# Patient Record
Sex: Female | Born: 1950 | Race: White | Hispanic: No | Marital: Married | Smoking: Never smoker
Health system: Southern US, Community
[De-identification: ages and names within clinical notes are randomized; demographics above are authoritative.]

## PROBLEM LIST (undated history)

## (undated) DIAGNOSIS — K219 Gastro-esophageal reflux disease without esophagitis: Secondary | ICD-10-CM

## (undated) DIAGNOSIS — N200 Calculus of kidney: Secondary | ICD-10-CM

## (undated) DIAGNOSIS — H35349 Macular cyst, hole, or pseudohole, unspecified eye: Secondary | ICD-10-CM

## (undated) DIAGNOSIS — Z8719 Personal history of other diseases of the digestive system: Secondary | ICD-10-CM

## (undated) DIAGNOSIS — Z87442 Personal history of urinary calculi: Secondary | ICD-10-CM

## (undated) DIAGNOSIS — F329 Major depressive disorder, single episode, unspecified: Secondary | ICD-10-CM

## (undated) DIAGNOSIS — M1711 Unilateral primary osteoarthritis, right knee: Secondary | ICD-10-CM

## (undated) DIAGNOSIS — F32A Depression, unspecified: Secondary | ICD-10-CM

## (undated) DIAGNOSIS — J189 Pneumonia, unspecified organism: Secondary | ICD-10-CM

## (undated) HISTORY — PX: EYE SURGERY: SHX253

## (undated) HISTORY — PX: PLACEMENT OF BREAST IMPLANTS: SHX6334

## (undated) HISTORY — PX: LAPAROTOMY: SHX154

## (undated) HISTORY — PX: COLONOSCOPY: SHX174

## (undated) HISTORY — PX: CHOLECYSTECTOMY: SHX55

## (undated) HISTORY — PX: WRIST SURGERY: SHX841

---

## 2002-10-08 ENCOUNTER — Ambulatory Visit (HOSPITAL_COMMUNITY): Admission: RE | Admit: 2002-10-08 | Discharge: 2002-10-08 | Payer: Self-pay | Admitting: General Surgery

## 2005-02-09 ENCOUNTER — Emergency Department (HOSPITAL_COMMUNITY): Admission: EM | Admit: 2005-02-09 | Discharge: 2005-02-09 | Payer: Self-pay | Admitting: Family Medicine

## 2005-06-05 ENCOUNTER — Emergency Department (HOSPITAL_COMMUNITY): Admission: EM | Admit: 2005-06-05 | Discharge: 2005-06-05 | Payer: Self-pay | Admitting: Family Medicine

## 2006-07-05 IMAGING — CR DG WRIST COMPLETE 3+V*L*
3 series · 3 of 3 positions shown · non-contrast
Comparison: None.

CLINICAL DATA: 54-year-old with wrist laceration.  Door fell on wrist.  Laceration of the radial aspect.  Hand pain with movement.

[view not recorded (1 of 3)]
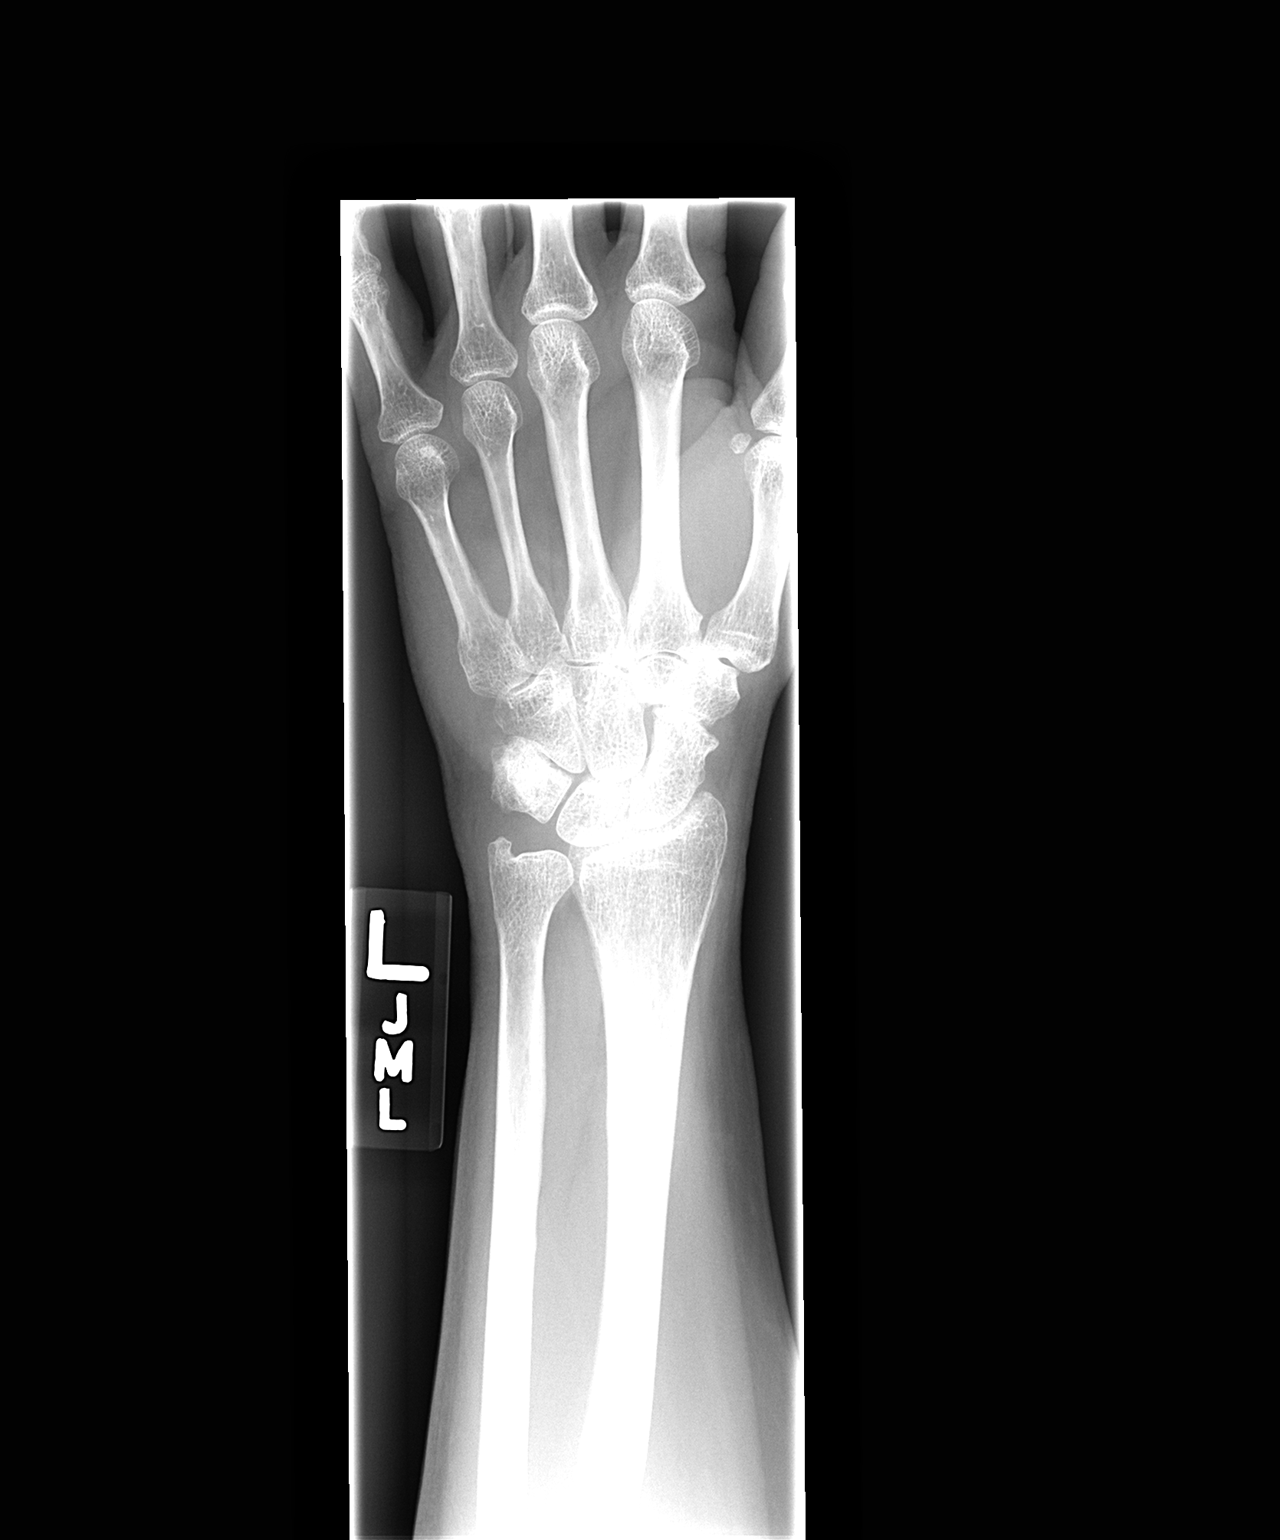

[view not recorded (2 of 3)]
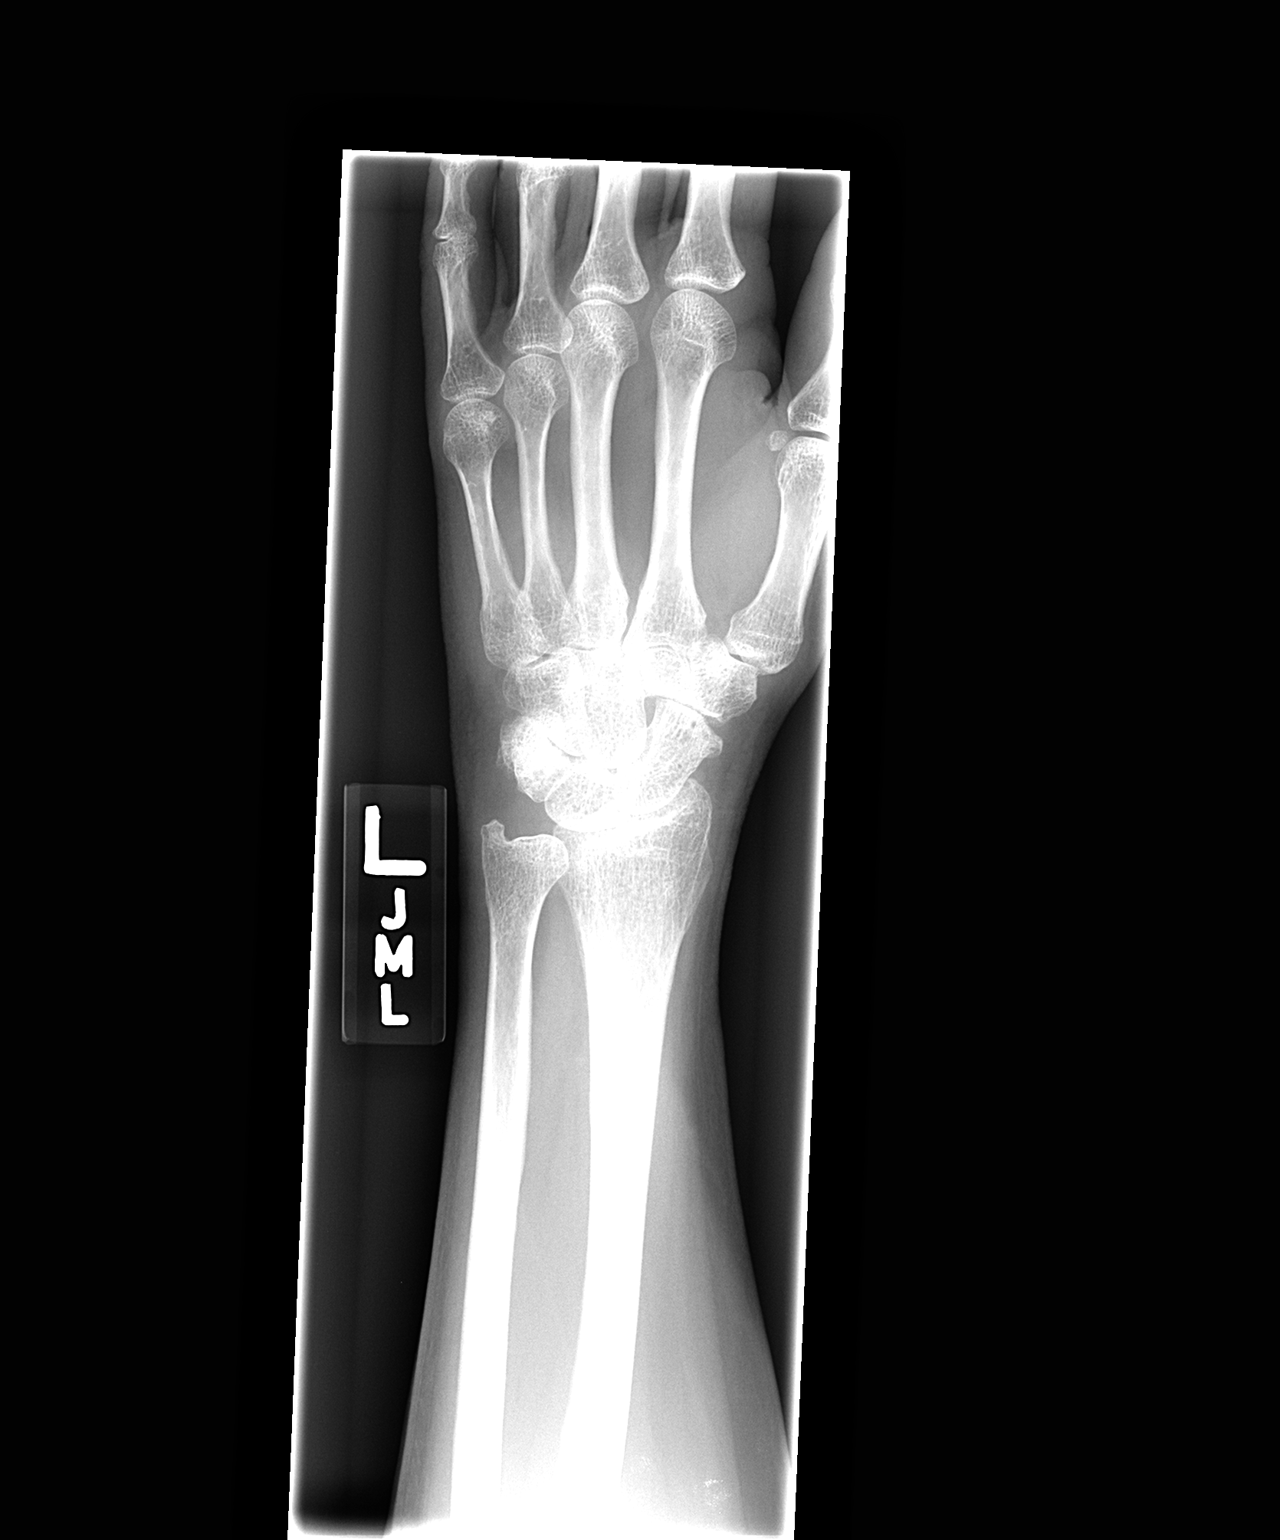

[view not recorded (3 of 3)]
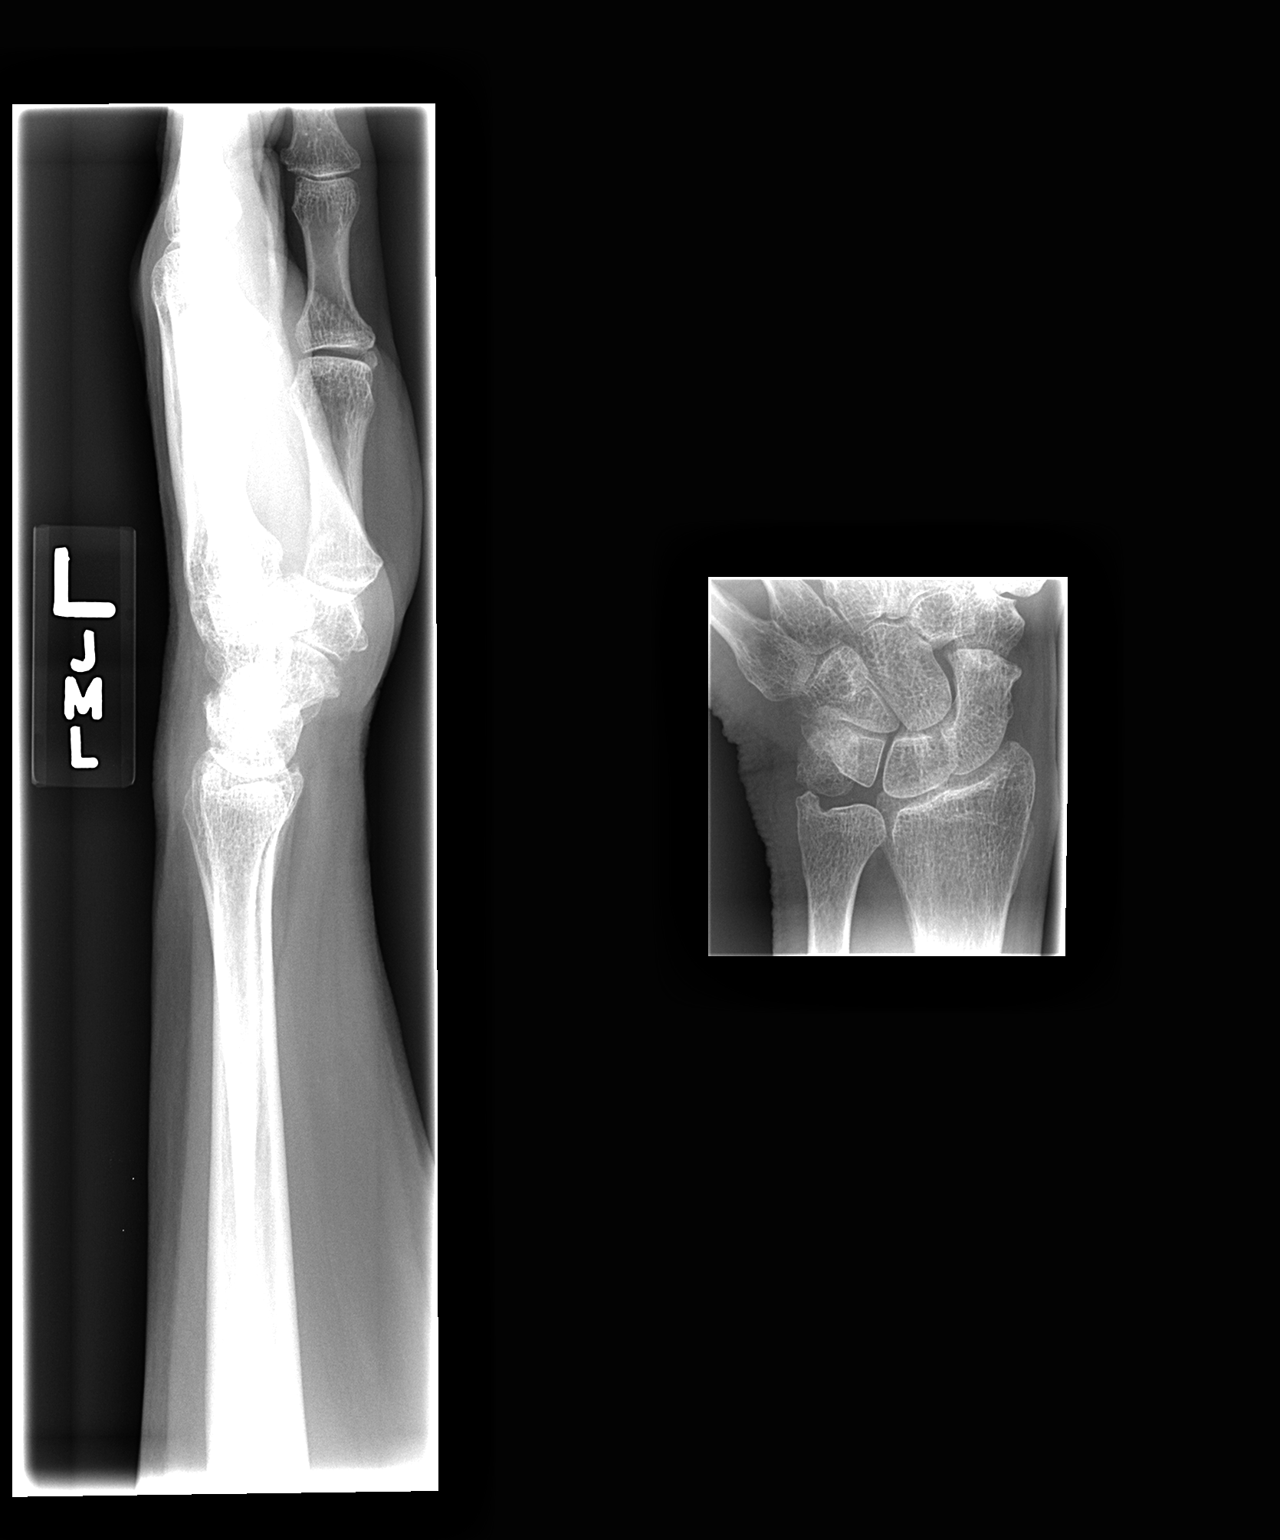

[3 of 3 positions shown; findings below may reference images not displayed]

LEFT ABLY0-Q VIEWS:
 There is marked osteopenia.  This does limit evaluation of minimally displaced and non-displaced fractures.  However, no evidence for acute fracture or dislocation.
IMPRESSION: Marked osteopenia without evidence for foreign body or acute bony abnormality.

## 2009-07-07 ENCOUNTER — Encounter: Payer: Self-pay | Admitting: Gastroenterology

## 2009-07-16 ENCOUNTER — Encounter: Payer: Self-pay | Admitting: Gastroenterology

## 2010-02-24 ENCOUNTER — Emergency Department (HOSPITAL_COMMUNITY): Admission: EM | Admit: 2010-02-24 | Discharge: 2010-02-25 | Payer: Self-pay | Admitting: Emergency Medicine

## 2010-02-25 ENCOUNTER — Inpatient Hospital Stay (HOSPITAL_COMMUNITY): Admission: AD | Admit: 2010-02-25 | Discharge: 2010-02-28 | Payer: Self-pay | Admitting: Psychiatry

## 2010-02-25 ENCOUNTER — Ambulatory Visit: Payer: Self-pay | Admitting: Psychiatry

## 2010-03-10 ENCOUNTER — Encounter (INDEPENDENT_AMBULATORY_CARE_PROVIDER_SITE_OTHER): Payer: Self-pay | Admitting: *Deleted

## 2010-04-08 ENCOUNTER — Encounter (INDEPENDENT_AMBULATORY_CARE_PROVIDER_SITE_OTHER): Payer: Self-pay | Admitting: *Deleted

## 2010-04-08 ENCOUNTER — Ambulatory Visit: Payer: Self-pay | Admitting: Gastroenterology

## 2010-04-08 DIAGNOSIS — M899 Disorder of bone, unspecified: Secondary | ICD-10-CM | POA: Insufficient documentation

## 2010-04-08 DIAGNOSIS — F329 Major depressive disorder, single episode, unspecified: Secondary | ICD-10-CM

## 2010-04-08 DIAGNOSIS — M949 Disorder of cartilage, unspecified: Secondary | ICD-10-CM

## 2010-04-08 DIAGNOSIS — K219 Gastro-esophageal reflux disease without esophagitis: Secondary | ICD-10-CM | POA: Insufficient documentation

## 2010-04-08 LAB — CONVERTED CEMR LAB
AST: 43 units/L — ABNORMAL HIGH (ref 0–37)
BUN: 15 mg/dL (ref 6–23)
Basophils Absolute: 0 10*3/uL (ref 0.0–0.1)
Bilirubin, Direct: 0.1 mg/dL (ref 0.0–0.3)
Chloride: 101 meq/L (ref 96–112)
Creatinine, Ser: 0.7 mg/dL (ref 0.4–1.2)
Ferritin: 19.8 ng/mL (ref 10.0–291.0)
Glucose, Bld: 81 mg/dL (ref 70–99)
Hemoglobin: 12.5 g/dL (ref 12.0–15.0)
IgA: 267 mg/dL (ref 68–378)
Iron: 89 ug/dL (ref 42–145)
Lymphocytes Relative: 24.5 % (ref 12.0–46.0)
Lymphs Abs: 1.4 10*3/uL (ref 0.7–4.0)
MCHC: 33.8 g/dL (ref 30.0–36.0)
MCV: 88.7 fL (ref 78.0–100.0)
Monocytes Absolute: 0.3 10*3/uL (ref 0.1–1.0)
Monocytes Relative: 5.6 % (ref 3.0–12.0)
Neutro Abs: 4 10*3/uL (ref 1.4–7.7)
RBC: 4.18 M/uL (ref 3.87–5.11)
RDW: 14.3 % (ref 11.5–14.6)
Saturation Ratios: 20.7 % (ref 20.0–50.0)
Sed Rate: 19 mm/hr (ref 0–22)
Sodium: 141 meq/L (ref 135–145)
TSH: 0.89 microintl units/mL (ref 0.35–5.50)
Tissue Transglutaminase Ab, IgA: 4.6 units (ref <20)
Transferrin: 307.4 mg/dL (ref 212.0–360.0)
Vitamin B-12: 304 pg/mL (ref 211–911)

## 2010-04-26 ENCOUNTER — Ambulatory Visit: Payer: Self-pay | Admitting: Gastroenterology

## 2010-04-29 ENCOUNTER — Encounter: Payer: Self-pay | Admitting: Gastroenterology

## 2010-05-10 ENCOUNTER — Ambulatory Visit: Payer: Self-pay | Admitting: Gastroenterology

## 2010-05-10 DIAGNOSIS — R945 Abnormal results of liver function studies: Secondary | ICD-10-CM | POA: Insufficient documentation

## 2010-05-10 LAB — CONVERTED CEMR LAB
Albumin: 3.9 g/dL (ref 3.5–5.2)
Alkaline Phosphatase: 91 units/L (ref 39–117)
Bilirubin, Direct: 0.1 mg/dL (ref 0.0–0.3)
Ceruloplasmin: 44 mg/dL (ref 21–63)
Total Bilirubin: 0.5 mg/dL (ref 0.3–1.2)

## 2010-12-08 NOTE — Assessment & Plan Note (Signed)
Summary: post proc, 2 weeks/dfs   History of Present Illness Visit Type: Follow-up Visit Primary GI MD: Sheryn Bison MD FACP FAGA Primary Provider: Kirstie Peri, MD  Requesting Provider: na Chief Complaint: 2 week follow up from procedure History of Present Illness:   This patient is asymptomatic on AcipHex 20 mg a day. Endoscopy showed severe erosive esophagitis confirmed by biopsies. Small bowel biopsy was normal. Labs were all normal except for mild hyper transaminasemia unexplained etiology. The patient denies any history of known liver disease, previous hepatitis or pancreatitis, with significant family history. She is status post cholecystectomy. She did pass his used NSAIDs for degenerative arthritis. She currently is on Abilify, Effexor, and AcipHex. She denies dysphagia, abdominal pain ,itching, or lower bowel problems. She does have chronic osteoporosis but is not taking calcium or iron.   GI Review of Systems      Denies abdominal pain, acid reflux, belching, bloating, chest pain, dysphagia with liquids, dysphagia with solids, heartburn, loss of appetite, nausea, vomiting, vomiting blood, weight loss, and  weight gain.        Denies anal fissure, black tarry stools, change in bowel habit, constipation, diarrhea, diverticulosis, fecal incontinence, heme positive stool, hemorrhoids, irritable bowel syndrome, jaundice, light color stool, liver problems, rectal bleeding, and  rectal pain.    Current Medications (verified): 1)  Abilify 5 Mg Tabs (Aripiprazole) .... Daily 2)  Effexor Xr 75 Mg Xr24h-Cap (Venlafaxine Hcl) .... Daily 3)  Aleve 220 Mg Tabs (Naproxen Sodium) .... As Needed 4)  Aciphex 20 Mg Tbec (Rabeprazole Sodium) .Marland Kitchen.. 1 By Mouth Once Daily  Allergies (verified): 1)  ! Prednisone 2)  ! 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  Alcohol Use - no Daily Caffeine Use: 7 or more   Past medical, surgical, family and social histories (including risk factors) reviewed for relevance to current acute and chronic problems.  Past Medical History: Reviewed history from 04/08/2010 and no changes required. Arthritis Depression Vertigo Kidney Stones Urinary Tract Infection  Past Surgical History: Reviewed history from 04/08/2010 and no changes required. Cholecystectomy C-section x 2  Breast Augmentations x 2   Family History: Reviewed history from 04/08/2010 and no changes required. Depression- mother Family History of Colon Cancer:Maternal Uncle  Family History of Stomach Cancer:MGGM Family History of Breast Cancer:Maternal Aunt and Paternal Aunt  Family History of Heart Disease: Father   Social History: Reviewed history from 04/08/2010 and no changes required. 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  Alcohol Use - no Daily Caffeine Use: 7 or more   Review of Systems  The patient denies anorexia, fever, weight loss, weight gain, vision loss, decreased hearing, hoarseness, chest pain, syncope, dyspnea on exertion, peripheral edema, prolonged cough, headaches, hemoptysis, abdominal pain, melena, hematochezia, severe indigestion/heartburn, hematuria, incontinence, genital sores, muscle weakness, suspicious skin lesions, transient blindness, difficulty walking, depression, unusual weight change, abnormal bleeding, enlarged lymph nodes, angioedema, breast masses, and testicular masses.    Vital Signs:  Patient profile:   60 year old female Height:      66 inches Weight:      188 pounds BMI:     30.45 BSA:     1.95 Pulse rate:   76 /  minute Pulse rhythm:    regular BP sitting:   100 / 70  (left arm)  Vitals Entered By: Merri Ray CMA Duncan Dull) (May 10, 2010 10:41 AM)  Physical Exam  General:  Well developed, well nourished, no acute distress.healthy appearing.   Head:  Normocephalic and atraumatic. Eyes:  PERRLA, no icterus.exam deferred to patient's ophthalmologist.   Psych:  Alert and cooperative. Normal mood and affect.   Impression & Recommendations:  Problem # 1:  LIVER FUNCTION TESTS, ABNORMAL, HX OF (ICD-V12.2) Assessment Unchanged Repeat liver function test and screen for metabolic causes of liver disease. 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 She had a previous reaction to Boniva type medication. Dependent on the results of these tests, repeat ultrasound may be needed. Orders: TLB-Hepatic/Liver Function Pnl (80076-HEPATIC) TLB-Sedimentation Rate (ESR) (85652-ESR) T-AMA (91478-29562) T-ANA [id number]) T-Anti SMA (96295-28413) T-Ceruloplasmin [phone]) T-Hepatitis B Surface Antigen (36644-03474) T-Hepatitis B Surface Antibody (25956-38756) T-Hepatitis C Anti HCV (43329)  Problem # 2:  OSTEOPENIA (ICD-733.90) Assessment: Unchanged Vitamin D. level ordered along with p.o. intake of 1200 mg a day of calcium daily vitamin D suggested. We discussed esophagitis and Boniva another biphosphonate medications and their side effects. For now, I would recommend that she continue daily AcipHex 20 mg since her review her record she has had recurrent esophagitis. Recent biopsies otherwise were unremarkable as was small bowel biopsy.Celiac antibodies also were normal. Orders: TLB-Hepatic/Liver Function Pnl (80076-HEPATIC) TLB-Sedimentation Rate (ESR) (85652-ESR) T-AMA (51884-16606) T-ANA [phone]) T-Anti SMA (35573-22025) T-Ceruloplasmin [phone]) T-Vitamin D (25-Hydroxy) (83151-76160)  Problem # 3:  GERD (ICD-530.81) Assessment: Improved Continue standard antireflux maneuvers along  with AcipHex 20 mg once a day and twice a day if needed during biphosphonate infusions. Reflux regime and patient education video viewed by the patient and her daughter.  Patient Instructions: 1)  Please go to the basement for lab work.  2)  Continue Aciphex. 3)  The medication list was reviewed and reconciled.  All changed / newly prescribed medications were explained.  A complete medication list was provided to the patient / caregiver. 4)  Avoid foods high in acid content ( tomatoes, citrus juices, spicy foods) . Avoid eating within 3 to 4 hours of lying down or before exercising. Do not over eat; try smaller more frequent meals. Elevate head of bed four inches when sleeping.  5)  Calcium with vitamin D daily suggested. 6)  Possible upper normal ultrasound exam depending on lab data. 7)  Copy sent to : Dr. Kirstie Peri.  Appended Document: post proc, 2 weeks/dfs    Clinical Lists Changes  Medications: Rx of ACIPHEX 20 MG TBEC (RABEPRAZOLE SODIUM) 1 by mouth once daily;  #90 x 3;  Signed;  Entered by: Ashok Cordia RN;  Authorized by: Mardella Layman MD [facility];  Method used: Electronically to [facility] [address] [place], [place], [place]  [address], Ph: [phone], Fax: [phone]    Prescriptions: ACIPHEX 20 MG TBEC (RABEPRAZOLE SODIUM) 1 by mouth once daily  #90 x 3   Entered by:   Ashok Cordia RN   Authorized by:   Mardella Layman MD [facility]   Signed by:   Ashok Cordia RN on 05/10/2010   Method used:   Electronically to        [facility]* (retail)       [address]       [place], [place]  [address]  Ph: [phone]       Fax: [phone]   RxID:   [id number]

## 2010-12-08 NOTE — Assessment & Plan Note (Signed)
Summary: CHEST DISCOMFORT/YF   History of Present Illness Visit Type: new patient  Primary GI MD: Sheryn Bison MD FACP FAGA Primary Provider: Kirstie Peri, MD  Requesting Provider: na Chief Complaint: GERD and dysphagia  History of Present Illness:   60 year old Caucasian female self referred for evaluation of a long history of a symptomatic hiatal hernia. Most of her records are in [place], [place] and are not available for review.  Apparently in August of 2010 the patient had acute nausea and vomiting, dehydration, chest pain, and was hospitalized in [place] and had workup by Dr. Magda Kiel. Apparently she had a large hiatal hernia and erosive esophagitis and also was consulted with Dr. Lovell Sheehan, surgery. Apparently she responded well to medical therapy, has had no further acid reflux symptoms or chest pain. Her above problems were initiated by taking Boniva for osteoporosis.  She had laparoscopic cholecystectomy 5 years ago. She has chronic low back pain and multiple arthropathy and is on Aleve 2 tablets a day. Other problems include bipolar disorder and she currently is on Abilify 5 mg a day, Effexor XR 75 mg a day, and recently was taken off Concerta. She apparently has recently had hospitalization at behavioral health. She relates that her depression is doing much better with the medications. She denies psychotic symptomatology.  Patient currently denies chest pain, reflux symptoms, nausea vomiting, or any change in her bowel habits. She apparently had a negative colonoscopy 4 years ago. She denies lower gastrointestinal problems at this time is having normal bowel movements without melena or hematochezia. She follows a regular diet and denies any specific food intolerances.She Does not smoke or abuse ethanol.   GI Review of Systems    Reports acid reflux, bloating, dysphagia with liquids, dysphagia with solids, and  heartburn.      Denies abdominal pain, belching, chest pain, loss of  appetite, nausea, vomiting, vomiting blood, weight loss, and  weight gain.        Denies anal fissure, black tarry stools, change in bowel habit, constipation, diarrhea, diverticulosis, fecal incontinence, heme positive stool, hemorrhoids, irritable bowel syndrome, jaundice, light color stool, liver problems, rectal bleeding, and  rectal pain.    Current Medications (verified): 1)  Abilify 5 Mg Tabs (Aripiprazole) .... Daily 2)  Effexor Xr 75 Mg Xr24h-Cap (Venlafaxine Hcl) .... Daily 3)  Aleve 220 Mg Tabs (Naproxen Sodium) .... As Needed  Allergies (verified): 1)  ! Prednisone 2)  ! Sulfa  Past History:  Past medical, surgical, family and social histories (including risk factors) reviewed for relevance to current acute and chronic problems.  Past Medical History: Arthritis Depression Vertigo Kidney Stones Urinary Tract Infection  Past Surgical History: Cholecystectomy C-section x 2  Breast Augmentations x 2   Family History: Reviewed history from 04/06/2010 and no changes required. Depression- mother Family History of Colon Cancer:Maternal Uncle  Family History of Stomach Cancer:MGGM Family History of Breast Cancer:Maternal Aunt and Paternal Aunt  Family History of Heart Disease: Father   Social History: Reviewed history from 04/06/2010 and no changes required. 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  Alcohol Use - no Daily Caffeine Use: 7 or more   Review of Systems       The patient complains of allergy/sinus, arthritis/joint pain, back pain, confusion, depression-new, fatigue, thirst - excessive, and urination - excessive.  The patient denies anemia, anxiety-new, blood in urine, breast changes/lumps, change in vision, cough, coughing up blood, fainting, fever, headaches-new, hearing problems, heart murmur, heart rhythm changes, itching,  menstrual pain, muscle pains/cramps, night sweats, nosebleeds, pregnancy symptoms, shortness of  breath, skin rash, sleeping problems, sore throat, swelling of feet/legs, swollen lymph glands, urination changes/pain, urine leakage, vision changes, and voice change.    Vital Signs:  Patient profile:   60 year old female Height:      66 inches Weight:      185 pounds BMI:     29.97 BSA:     1.94 Pulse rate:   72 / minute Pulse rhythm:   regular BP sitting:   132 / 80  (left arm) Cuff size:   regular  Vitals Entered By: Ok Anis CMA (April 08, 2010 10:26 AM)  Physical Exam  General:  Well developed, well nourished, no acute distress.healthy appearing.   Head:  Normocephalic and atraumatic. Eyes:  PERRLA, no icterus. Mouth:  No deformity or lesions, dentition normal. Neck:  Supple; no masses or thyromegaly. Lungs:  Clear throughout to auscultation. Heart:  Regular rate and rhythm; no murmurs, rubs,  or bruits. Abdomen:  Soft, nontender and nondistended. No masses, hepatosplenomegaly or hernias noted. Normal bowel sounds. Msk:  Symmetrical with no gross deformities. Normal posture. Pulses:  Normal pulses noted. Extremities:  No clubbing, cyanosis, edema or deformities noted. Neurologic:  Alert and  oriented x4;  grossly normal neurologically. Cervical Nodes:  No significant cervical adenopathy. Psych:  Alert and cooperative. Normal mood and affect.   Impression & Recommendations:  Problem # 1:  GERD (ICD-530.81) Assessment Improved She currently is asymptomatic and denies any acid reflux symptoms. She has multiple concerns about" a large hiatal hernia" and the possible need for surgical therapy should she have to resume medications for osteoporosis. It certainly is reasonable to proceed with endoscopic exam before considering restarting these medications. She is not on PPI therapy at this time. She is status post cholecystectomy. Screening labs also been ordered as has endoscopic exam with sedation and usual monitoring. Orders: TLB-CBC Platelet - w/Differential  (85025-CBCD) TLB-BMP (Basic Metabolic Panel-BMET) (80048-METABOL) TLB-Hepatic/Liver Function Pnl (80076-HEPATIC) TLB-TSH (Thyroid Stimulating Hormone) (84443-TSH) TLB-B12, Serum-Total ONLY (44010-U72) TLB-Ferritin (82728-FER) TLB-Folic Acid (Folate) (82746-FOL) TLB-IBC Pnl (Iron/FE;Transferrin) (83550-IBC) TLB-Sedimentation Rate (ESR) (85652-ESR) TLB-IgA (Immunoglobulin A) (82784-IGA) T-Sprue Panel (Celiac Disease Aby Eval) (83516x3/[address])  Problem # 2:  DEPRESSION (ICD-311) Assessment: Improved continue multiple medications per Dr. Sherryll Burger  and behavioral health care.  Problem # 3:  OSTEOPENIA (ICD-733.90) Assessment: Unchanged Labs and vitamin D level ordered. I advised her that she should be on calcium with vitamin D replacement therapy. It is noted that she has multiple drug allergies and sensitivities.  Patient Instructions: 1)  Please go to the basement for lab work. 2)  You are scheduled for an upper endoscopy. 3)  The medication list was reviewed and reconciled.  All changed / newly prescribed medications were explained.  A complete medication list was provided to the patient / caregiver. 4)  Copy sent to : Monia Pouch. 5)  Please continue current medications.  6)  Conscious Sedation brochure given.  7)  Upper Endoscopy brochure given.   Appended Document: CHEST DISCOMFORT/YF    Clinical Lists Changes  Orders: Added new Test order of EGD (EGD) - Signed

## 2010-12-08 NOTE — Letter (Signed)
Summary: Patient [facility] Biopsy Results  [facility]  [address] [place], [place] [address]   Phone: [phone]  Fax: [phone]        April 29, 2010 MRN: [id number]    Kelly Montes [address] [place], [place]  [address]    Dear [name],  I am pleased to inform you that the biopsies taken during your recent endoscopic examination did not show any evidence of cancer upon pathologic examination.  Additional information/recommendations:  __No further action is needed at this time.  Please follow-up with      your primary care physician for your other healthcare needs.  __ Please call [phone] to schedule a return visit to review      your condition.  _x_ Continue with the treatment plan as outlined on the day of your      exam.  __ You should have a repeat endoscopic examination for this problem              in _ months/years.   Please call us if you are having persistent problems or have questions about your condition that have not been fully answered at this time.  Sincerely,  Mardella Layman MD [facility]  This letter has been electronically signed by your physician.  Appended Document: Patient Notice-Endo Biopsy Results letter mailed.

## 2010-12-08 NOTE — Procedures (Signed)
Summary: EGD/[facility]  EGD/[facility]   Imported By: Sherian Rein 04/18/2010 11:38:47  _____________________________________________________________________  External Attachment:    Type:   Image     Comment:   External Document

## 2010-12-08 NOTE — Letter (Signed)
Summary: EGD Instructions  [facility]  [address] [place], [place] [address]   Phone: [phone]  Fax: [phone]       FAWNDA VITULLO    1950/12/03    MRN: [id number]       Procedure Day /Date: Tuesday, 04/26/10     Arrival Time:  1:30     Procedure Time: 2:30     Location of Procedure:                    [facility] (4th Floor)    PREPARATION FOR ENDOSCOPY   On 04/26/10 THE DAY OF THE PROCEDURE:  1.   No solid foods, milk or milk products are allowed after midnight the night before your procedure.  2.   Do not drink anything colored red or purple.  Avoid juices with pulp.  No orange juice.  3.  You may drink clear liquids until 12:30, which is 2 hours before your procedure.                                                                                                CLEAR LIQUIDS INCLUDE: Water Jello Ice Popsicles Tea (sugar ok, no milk/cream) Powdered fruit flavored drinks Coffee (sugar ok, no milk/cream) Gatorade Juice: apple, white grape, white cranberry  Lemonade Clear bullion, consomm, broth Carbonated beverages (any kind) Strained chicken noodle soup Hard Candy   MEDICATION INSTRUCTIONS  Unless otherwise instructed, you should take regular prescription medications with a small sip of water as early as possible the morning of your procedure.                       OTHER INSTRUCTIONS  You will need a responsible adult at least 59 years of age to accompany you and drive you home.   This person must remain in the waiting room during your procedure.  Wear loose fitting clothing that is easily removed.  Leave jewelry and other valuables at home.  However, you may wish to bring a book to read or an iPod/MP3 player to listen to music as you wait for your procedure to start.  Remove all body piercing jewelry and leave at home.  Total time from sign-in until discharge is approximately 2-3 hours.  You  should go home directly after your procedure and rest.  You can resume normal activities the day after your procedure.  The day of your procedure you should not:   Drive   Make legal decisions   Operate machinery   Drink alcohol   Return to work  You will receive specific instructions about eating, activities and medications before you leave.    The above instructions have been reviewed and explained to me by   _______________________    I fully understand and can verbalize these instructions _____________________________ Date _________

## 2010-12-08 NOTE — Procedures (Signed)
Summary: Upper Endoscopy  Patient: [name] Note: All result statuses are Final unless otherwise noted.  Tests: (1) Upper Endoscopy (EGD)   EGD Upper Endoscopy       DONE     [facility]     [address]     [place], [place]  [address]           ENDOSCOPY PROCEDURE REPORT           PATIENT:  Kelly Montes, Kelly Montes  MR#:  [id number]     BIRTHDATE:  1950/12/17, 59 yrs. old  GENDER:  female           ENDOSCOPIST:  Vania Rea. Jarold Motto, MD, [facility]     Referred by:           PROCEDURE DATE:  04/26/2010     PROCEDURE:  EGD with biopsy     ASA CLASS:  Class II     INDICATIONS:  GERD CHRONIC CHEST PAIN AND REFRACRORY GERD     SYMPTOMS.           MEDICATIONS:   Fentanyl 50 mcg IV, Versed 6 mg IV     TOPICAL ANESTHETIC:           DESCRIPTION OF PROCEDURE:   After the risks benefits and     alternatives of the procedure were thoroughly explained, informed     consent was obtained.  The LB GIF-H180 [id number] endoscope was     introduced through the mouth and advanced to the second portion of     the duodenum, without limitations.  The instrument was slowly     withdrawn as the mucosa was fully examined.     <<PROCEDUREIMAGES>>           Multiple erosions were found in the distal esophagus. SEE     PICTURES.MULTIPLE LINEAR EROSIONS NOTED.BX. OF GEJ DONE.  A hiatal     hernia was found. 5 CM. HH NOTED,,,,  Normal duodenal folds were     noted. SMALL BOWEL BX. DONE,,,  The stomach was entered and     closely examined. The antrum, angularis, and lesser curvature were     well visualized, including a retroflexed view of the cardia and     fundus. The stomach wall was normally distensable. The scope     passed easily through the pylorus into the duodenum. VOCAL CORDS     AND PROXIMAL ESOPHAGUS APPEAR NORMAL.    Retroflexed views     revealed a hiatal hernia.    The scope was then withdrawn from the     patient and the procedure completed.           COMPLICATIONS:  None        ENDOSCOPIC IMPRESSION:     1) Erosions, multiple in the distal esophagus     2) Hiatal hernia     3) Normal duodenal folds     4) Normal stomach     5) A hiatal hernia     CHRONIC EROSIVE GERD.PROMINANT HH.R/O BARRETT'S MUCOSA,,,,WE     WILL CONSIDER FUNDOPLICATION SURGERY.     RECOMMENDATIONS:     1) Await biopsy results     2) continue PPI     SEE ME 2 WEEKS.ALSO NEED TO REPEAT LFT'S ON RTC VISIT.           REPEAT EXAM:  No           ______________________________     Vania Rea. Jarold Motto, MD,  FACG           CC:  Kirstie Peri, MD           n.     Rosalie DoctorVania Rea. Coralee Edberg at 04/26/2010 02:46 PM           [place], [place], [phone]  Note: An exclamation mark (!) indicates a result that was not dispersed into the flowsheet. Document Creation Date: 04/26/2010 2:46 PM _______________________________________________________________________  (1) Order result status: Final Collection or observation date-time: 04/26/2010 14:35 Requested date-time:  Receipt date-time:  Reported date-time:  Referring Physician:   Ordering Physician: Sheryn Bison [phone]) Specimen Source:  Source: Launa Grill Order Number: [id number] Lab site:

## 2010-12-08 NOTE — Letter (Signed)
Summary: New Patient letter  [facility]  [address] [place], [place] [address]   Phone: [phone]  Fax: [phone]       03/10/2010 MRN: [id number]  Kelly Montes [address] [place], [place]  [address]  Dear Kelly Montes,  Welcome to the Gastroenterology Division at [facility].    You are scheduled to see Dr.  Jarold Motto on 04-08-10 at 10am on the 3rd floor at [facility], [address]  We ask that you try to arrive at our office 15 minutes prior to your appointment time to allow for check-in.  We would like you to complete the enclosed self-administered evaluation form prior to your visit and bring it with you on the day of your appointment.  We will review it with you.  Also, please bring a complete list of all your medications or, if you prefer, bring the medication bottles and we will list them.  Please bring your insurance card so that we may make a copy of it.  If your insurance requires a referral to see a specialist, please bring your referral form from your primary care physician.  Co-payments are due at the time of your visit and may be paid by cash, check or credit card.     Your office visit will consist of a consult with your physician (includes a physical exam), any laboratory testing he/she may order, scheduling of any necessary diagnostic testing (e.g. x-ray, ultrasound, CT-scan), and scheduling of a procedure (e.g. Endoscopy, Colonoscopy) if required.  Please allow enough time on your schedule to allow for any/all of these possibilities.    If you cannot keep your appointment, please call [phone] to cancel or reschedule prior to your appointment date.  This allows [place] the opportunity to schedule an appointment for another patient in need of care.  If you do not cancel or reschedule by 5 p.m. the business day prior to your appointment date, you will be charged a $50.00 late cancellation/no-show fee.    Thank you for choosing  [facility] for your medical needs.  We appreciate the opportunity to care for you.  Please visit [place] at our website  to learn more about our practice.                     Sincerely,                                                             The Gastroenterology Division

## 2011-01-24 LAB — COMPREHENSIVE METABOLIC PANEL
Alkaline Phosphatase: 84 U/L (ref 39–117)
BUN: 10 mg/dL (ref 6–23)
CO2: 29 mEq/L (ref 19–32)
Creatinine, Ser: 1.03 mg/dL (ref 0.4–1.2)
GFR calc Af Amer: >60 mL/min (ref >60)
GFR calc non Af Amer: 55 mL/min — ABNORMAL LOW (ref >60)
Sodium: 139 mEq/L (ref 135–145)
Total Bilirubin: 0.3 mg/dL (ref 0.3–1.2)
Total Protein: 6.8 g/dL (ref 6.0–8.3)

## 2011-01-24 LAB — DIFFERENTIAL
Basophils Absolute: 0.1 10*3/uL (ref 0.0–0.1)
Eosinophils Absolute: 0.2 10*3/uL (ref 0.0–0.7)
Eosinophils Relative: 3 % (ref 0–5)
Monocytes Absolute: 0.3 10*3/uL (ref 0.1–1.0)
Neutrophils Relative %: 64 % (ref 43–77)

## 2011-01-24 LAB — RAPID URINE DRUG SCREEN, HOSP PERFORMED
Benzodiazepines: POSITIVE — AB
Cocaine: NOT DETECTED
Tetrahydrocannabinol: NOT DETECTED

## 2011-01-24 LAB — CBC
HCT: 39.6 % (ref 36.0–46.0)
Hemoglobin: 13.1 g/dL (ref 12.0–15.0)
MCHC: 33.1 g/dL (ref 30.0–36.0)
MCV: 89.2 fL (ref 78.0–100.0)
Platelets: 246 10*3/uL (ref 150–400)
RBC: 4.44 MIL/uL (ref 3.87–5.11)
RDW: 13.4 % (ref 11.5–15.5)
WBC: 5.2 10*3/uL (ref 4.0–10.5)

## 2011-01-24 LAB — ETHANOL: Alcohol, Ethyl (B): <5 mg/dL (ref 0–10)

## 2011-01-24 LAB — T3: T3, Total: 103 ng/dl (ref 80.0–204.0)

## 2011-01-24 LAB — T4: T4, Total: 8.5 ug/dL (ref 5.0–12.5)

## 2011-03-24 NOTE — H&P (Signed)
   Kelly Montes, Kelly Montes NO.:  [id number]   MEDICAL RECORD NO.:  [id number]                  PATIENT TYPE:   LOCATION:                                       FACILITY:   PHYSICIAN:  Dalia Heading, M.D.               DATE OF BIRTH:  06/22/1951   DATE OF ADMISSION:  10/08/2002  DATE OF DISCHARGE:                                HISTORY & PHYSICAL   CHIEF COMPLAINT:  Biliary colic secondary to cholelithiasis.   HISTORY OF PRESENT ILLNESS:  The patient is a 60 year old white female who  is referred for evaluation and treatment of biliary colic secondary to  cholelithiasis.  She has been having right upper quadrant abdominal pain,  nausea, bloating, and fatty food intolerance over the past few weeks.  She  had a severe enough of an episode requiring a recent ER visit to [facility].  She has been getting worse.  No fever, chills, or jaundice have been noted.   PAST MEDICAL HISTORY:  Depression.   PAST SURGICAL HISTORY:  1. C-sections.  2. Laparoscopy.  3. Breast augmentation.   CURRENT MEDICATIONS:  Effexor 75 mg p.o. b.i.d.   ALLERGIES:  No known drug allergies.   REVIEW OF SYSTEMS:  Noncontributory.   PHYSICAL EXAMINATION:  GENERAL:  Well-developed, well-nourished white female  in no acute distress.  VITAL SIGNS:  Afebrile, and vital signs are stable.  HEENT:  No scleral icterus.  LUNGS:  Clear to auscultation with equal breath sounds bilaterally.  HEART:  Regular rate and rhythm.  Without S3, S4, or murmurs.  ABDOMEN:  Soft, with tenderness noted in the right upper quadrant to  palpation. No hepatosplenomegaly, masses, or hernias are noted.   LABORATORY DATA:  Ultrasound of the gallbladder reveals cholelithiasis with  a normal common bile duct.   IMPRESSION:  1. Biliary colic.  2. Cholelithiasis.    PLAN:  The patient is scheduled for a laparoscopic cholecystectomy on  October 08, 2002.  The risks and benefits of the procedure  including  bleeding, infection, hepatobiliary injury, and the possibility of an open  procedure were fully explained to the patient, who gave informed consent.                                                 Dalia Heading, M.D.    MAJ/MEDQ  D:  09/30/2002  T:  09/30/2002  Job:  [id number]   cc:   Wyvonnia Lora  [address]  [place]  [place] [address]  Fax: [phone]

## 2011-03-24 NOTE — Op Note (Signed)
NAME:  Kelly Montes, Kelly Montes                            ACCOUNT NO.:  [id number]   MEDICAL RECORD NO.:  [id number]                   PATIENT TYPE:  AMB   LOCATION:  DAY                                  FACILITY:  [facility]   PHYSICIAN:  Dalia Heading, M.D.               DATE OF BIRTH:  1951/01/10   DATE OF PROCEDURE:  DATE OF DISCHARGE:                                 OPERATIVE REPORT   PREOPERATIVE DIAGNOSIS:  Biliary colic, cholelithiasis.   POSTOPERATIVE DIAGNOSIS:  Biliary colic, cholelithiasis.   OPERATION/PROCEDURE:  Laparoscopic cholecystectomy.   ASSISTANT:  Bernerd Limbo. Leona Carry, M.D.   ANESTHESIA:  General endotracheal.   INDICATIONS FOR PROCEDURE:  The patient is a 60 year old white female who is  referred for evaluation and treatment of biliary colic secondary to  cholelithiasis.  The risks and benefits of the procedure including bleeding,  infection, hepatobiliary injury and the possibility of an open procedure  were fully explained to the patient, gaining informed consent.   DESCRIPTION OF PROCEDURE:  The patient was placed in the supine position.  After the induction of general endotracheal anesthesia, the abdomen was  prepped and draped using the usual sterile technique with Betadine.   A supraumbilical incision was made down to the fascia.  A Veress needle was  introduced into the abdominal cavity and confirmation of placement of the  needle was done using the saline drop test.  The abdomen was then  insufflated to 16 mmHg pressure.  An 11 mm trocar was introduced into the  abdominal cavity under direct visualization without difficulty.  The patient  was placed in the reversed Trendelenburg position and an additional 11 mm  trocar was placed in the epigastric region and the 5 mm trocars were placed  in the right upper quadrant and right flank regions.  The liver was  inspected and noted to be within normal limits.  The gallbladder was  retracted superiorly and laterally.   The dissection begun around the  infundibulum with the gallbladder.  The cystic duct was first identified.  Its juncture to the infundibulum was well identified.  Endo-clips were  placed proximally and distally on the cystic duct and the cystic duct was  divided.  This was likewise done of the cystic artery.  The gallbladder was  then freed away from the gallbladder fossa using Bovie electrocautery.  The  gallbladder was delivered through the epigastric trocar site using an endo-  catch bag.  The gallbladder fossa was inspected and no abnormal bleeding or  bile leakage was noted.  Surgicel was placed in the gallbladder fossa.  All  air was then evacuated from the abdominal cavity prior to removal of the  trocars.   All wounds were irrigated with normal saline.  All wounds were injected with  0.5% Sensorcaine.  The supraumbilical fascia was reapproximated using an 0  Vicryl interrupted suture.  All skin incisions  were closed using staples.  Betadine ointment and dry sterile dressings were applied.   All tape and needle counts were correct at the end of the procedure.  The  patient was extubated in the operating room going back to the recovery room,  awake and in stable condition.   COMPLICATIONS:  None.   SPECIMENS:  Gallbladder with stone.   ESTIMATED BLOOD LOSS:  Minimal.                                                Dalia Heading, M.D.    MAJ/MEDQ  D:  10/08/2002  T:  10/08/2002  Job:  [id number]   cc:   Wyvonnia Lora  [address]  [place]  [place] [address]  Fax: [phone]

## 2012-02-27 ENCOUNTER — Other Ambulatory Visit: Payer: Self-pay | Admitting: Adult Health

## 2012-02-27 ENCOUNTER — Other Ambulatory Visit (HOSPITAL_COMMUNITY)
Admission: RE | Admit: 2012-02-27 | Discharge: 2012-02-27 | Disposition: A | Discharge status: Home / Self Care | Payer: Self-pay | Source: Ambulatory Visit | Attending: Obstetrics and Gynecology | Admitting: Obstetrics and Gynecology

## 2012-02-27 DIAGNOSIS — Z01419 Encounter for gynecological examination (general) (routine) without abnormal findings: Secondary | ICD-10-CM | POA: Insufficient documentation

## 2012-02-29 ENCOUNTER — Encounter: Payer: Self-pay | Admitting: Gastroenterology

## 2012-04-03 ENCOUNTER — Encounter: Payer: Self-pay | Admitting: Gastroenterology

## 2012-04-12 ENCOUNTER — Ambulatory Visit (INDEPENDENT_AMBULATORY_CARE_PROVIDER_SITE_OTHER): Payer: 59 | Admitting: Urology

## 2012-04-12 DIAGNOSIS — N952 Postmenopausal atrophic vaginitis: Secondary | ICD-10-CM

## 2012-04-12 DIAGNOSIS — N3020 Other chronic cystitis without hematuria: Secondary | ICD-10-CM

## 2012-08-02 ENCOUNTER — Ambulatory Visit: Payer: 59 | Admitting: Urology

## 2015-01-22 ENCOUNTER — Other Ambulatory Visit: Payer: Self-pay | Admitting: Ophthalmology

## 2015-02-22 ENCOUNTER — Encounter (HOSPITAL_COMMUNITY): Payer: Self-pay

## 2015-02-22 ENCOUNTER — Encounter (HOSPITAL_COMMUNITY)
Admission: RE | Admit: 2015-02-22 | Discharge: 2015-02-22 | Disposition: A | Discharge status: Home / Self Care | Payer: 59 | Source: Ambulatory Visit | Attending: Ophthalmology | Admitting: Ophthalmology

## 2015-02-22 DIAGNOSIS — Z01812 Encounter for preprocedural laboratory examination: Secondary | ICD-10-CM | POA: Insufficient documentation

## 2015-02-22 DIAGNOSIS — H35341 Macular cyst, hole, or pseudohole, right eye: Secondary | ICD-10-CM | POA: Insufficient documentation

## 2015-02-22 HISTORY — DX: Gastro-esophageal reflux disease without esophagitis: K21.9

## 2015-02-22 HISTORY — DX: Depression, unspecified: F32.A

## 2015-02-22 HISTORY — DX: Major depressive disorder, single episode, unspecified: F32.9

## 2015-02-22 LAB — CBC
HEMATOCRIT: 37.8 % (ref 36.0–46.0)
HEMOGLOBIN: 11.9 g/dL — AB (ref 12.0–15.0)
MCH: 28.7 pg (ref 26.0–34.0)
MCHC: 31.5 g/dL (ref 30.0–36.0)
MCV: 91.3 fL (ref 78.0–100.0)
PLATELETS: 238 10*3/uL (ref 150–400)
RBC: 4.14 MIL/uL (ref 3.87–5.11)
RDW: 14.1 % (ref 11.5–15.5)
WBC: 6.2 10*3/uL (ref 4.0–10.5)

## 2015-02-22 LAB — BASIC METABOLIC PANEL
ANION GAP: 12 (ref 5–15)
BUN: 6 mg/dL (ref 6–23)
CHLORIDE: 105 mmol/L (ref 96–112)
CO2: 23 mmol/L (ref 19–32)
Calcium: 9 mg/dL (ref 8.4–10.5)
Creatinine, Ser: 0.99 mg/dL (ref 0.50–1.10)
GFR, EST AFRICAN AMERICAN: 69 mL/min — AB (ref >90)
GFR, EST NON AFRICAN AMERICAN: 59 mL/min — AB (ref >90)
Glucose, Bld: 107 mg/dL — ABNORMAL HIGH (ref 70–99)
POTASSIUM: 4.1 mmol/L (ref 3.5–5.1)
SODIUM: 140 mmol/L (ref 135–145)

## 2015-02-22 NOTE — Pre-Procedure Instructions (Signed)
Kelly Montes  02/22/2015   Your procedure is scheduled on:  March 01, 2015  Report to [facility] Admitting at 3 PM.  Call this number if you have problems the morning of surgery: [phone]   Remember:   Do not eat food or drink liquids after midnight.   Take these medicines the morning of surgery with A SIP OF WATER: escitalopram (LEXAPRO), RABEprazole (ACIPHEX)   STOP NSAIDS (NAPROXEN, ADVIL, IBUPROFEN) AS OF TODAY   Do not wear jewelry, make-up or nail polish.  Do not wear lotions, powders, or perfumes. You may wear deodorant.  Do not shave 48 hours prior to surgery. Men may shave face and neck.  Do not bring valuables to the hospital.  [facility] is not responsible   for any belongings or valuables.               Contacts, dentures or bridgework may not be worn into surgery.  Leave suitcase in the car. After surgery it may be brought to your room.  For patients admitted to the hospital, discharge time is determined by your  treatment team.               Patients discharged the day of surgery will not be allowed to drive home.  Name and phone number of your driver: FAMILY/FRIEND  Special Instructions: "PREPARING FOR SURGERY"   Please read over the following fact sheets that you were given: Pain Booklet, Coughing and Deep Breathing and Surgical Site Infection Prevention

## 2015-02-28 MED ORDER — PHENYLEPHRINE HCL 2.5 % OP SOLN
1.0000 [drp] | OPHTHALMIC | Status: AC
  Filled 2015-02-28: qty 2

## 2015-02-28 MED ORDER — ATROPINE SULFATE 1 % OP SOLN
1.0000 [drp] | OPHTHALMIC | Status: AC
  Filled 2015-02-28: qty 5

## 2015-03-01 ENCOUNTER — Inpatient Hospital Stay (HOSPITAL_COMMUNITY): Payer: 59 | Admitting: Certified Registered"

## 2015-03-01 ENCOUNTER — Encounter (HOSPITAL_COMMUNITY): Payer: Self-pay | Admitting: *Deleted

## 2015-03-01 ENCOUNTER — Encounter (HOSPITAL_COMMUNITY)
Admission: RE | Disposition: A | Discharge status: Home / Self Care | Payer: Self-pay | Source: Ambulatory Visit | Attending: Ophthalmology

## 2015-03-01 ENCOUNTER — Ambulatory Visit (HOSPITAL_COMMUNITY)
Admission: RE | Admit: 2015-03-01 | Discharge: 2015-03-01 | Disposition: A | Discharge status: Home / Self Care | Payer: 59 | Source: Ambulatory Visit | Attending: Ophthalmology | Admitting: Ophthalmology

## 2015-03-01 DIAGNOSIS — F329 Major depressive disorder, single episode, unspecified: Secondary | ICD-10-CM | POA: Insufficient documentation

## 2015-03-01 DIAGNOSIS — H35342 Macular cyst, hole, or pseudohole, left eye: Secondary | ICD-10-CM

## 2015-03-01 DIAGNOSIS — Z79899 Other long term (current) drug therapy: Secondary | ICD-10-CM | POA: Diagnosis not present

## 2015-03-01 DIAGNOSIS — K219 Gastro-esophageal reflux disease without esophagitis: Secondary | ICD-10-CM | POA: Insufficient documentation

## 2015-03-01 DIAGNOSIS — Z9849 Cataract extraction status, unspecified eye: Secondary | ICD-10-CM | POA: Diagnosis not present

## 2015-03-01 DIAGNOSIS — H35371 Puckering of macula, right eye: Secondary | ICD-10-CM | POA: Diagnosis not present

## 2015-03-01 DIAGNOSIS — H35343 Macular cyst, hole, or pseudohole, bilateral: Secondary | ICD-10-CM | POA: Insufficient documentation

## 2015-03-01 DIAGNOSIS — Z791 Long term (current) use of non-steroidal anti-inflammatories (NSAID): Secondary | ICD-10-CM | POA: Insufficient documentation

## 2015-03-01 HISTORY — PX: PARS PLANA VITRECTOMY: SHX2166

## 2015-03-01 HISTORY — PX: GAS/FLUID EXCHANGE: SHX5334

## 2015-03-01 SURGERY — PARS PLANA VITRECTOMY WITH 25 GAUGE
Anesthesia: Monitor Anesthesia Care | Laterality: Right

## 2015-03-01 MED ORDER — LIDOCAINE HCL 2 % IJ SOLN
INTRAMUSCULAR | Status: DC | PRN
  Administered 2015-03-01: 6.5 mL via RETROBULBAR

## 2015-03-01 MED ORDER — BSS IO SOLN
INTRAOCULAR | Status: AC
  Filled 2015-03-01: qty 15

## 2015-03-01 MED ORDER — EPHEDRINE SULFATE 50 MG/ML IJ SOLN
INTRAMUSCULAR | Status: AC
  Filled 2015-03-01: qty 1

## 2015-03-01 MED ORDER — HYPROMELLOSE (GONIOSCOPIC) 2.5 % OP SOLN
OPHTHALMIC | Status: AC
  Filled 2015-03-01: qty 15

## 2015-03-01 MED ORDER — BUPIVACAINE HCL (PF) 0.75 % IJ SOLN
INTRAMUSCULAR | Status: AC
  Filled 2015-03-01: qty 10

## 2015-03-01 MED ORDER — BSS IO SOLN
INTRAOCULAR | Status: DC | PRN
  Administered 2015-03-01: 15 mL via INTRAOCULAR

## 2015-03-01 MED ORDER — INDOCYANINE GREEN 25 MG IV SOLR
10.0000 mg | INTRAVENOUS | Status: DC | PRN
  Administered 2015-03-01: 10 mg via INTRAVENOUS

## 2015-03-01 MED ORDER — PROPOFOL 10 MG/ML IV BOLUS
INTRAVENOUS | Status: DC | PRN
  Administered 2015-03-01: 30 mg via INTRAVENOUS
  Administered 2015-03-01 (×2): 20 mg via INTRAVENOUS

## 2015-03-01 MED ORDER — STERILE WATER FOR INJECTION IJ SOLN
INTRAMUSCULAR | Status: AC
  Administered 2015-03-01: .75 mL
  Filled 2015-03-01: qty 0.15

## 2015-03-01 MED ORDER — HYPROMELLOSE (GONIOSCOPIC) 2.5 % OP SOLN
OPHTHALMIC | Status: DC | PRN
  Administered 2015-03-01: 10 [drp] via OPHTHALMIC

## 2015-03-01 MED ORDER — SODIUM CHLORIDE 0.9 % IV SOLN
INTRAVENOUS | Status: DC | PRN
  Administered 2015-03-01 (×2): via INTRAVENOUS

## 2015-03-01 MED ORDER — MIDAZOLAM HCL 2 MG/2ML IJ SOLN
INTRAMUSCULAR | Status: AC
  Filled 2015-03-01: qty 2

## 2015-03-01 MED ORDER — SODIUM CHLORIDE 0.9 % IJ SOLN
INTRAMUSCULAR | Status: AC
  Filled 2015-03-01: qty 10

## 2015-03-01 MED ORDER — ATROPINE SULFATE 1 % OP SOLN
1.0000 [drp] | OPHTHALMIC | Status: AC
Start: 2015-03-01 — End: 2015-03-01
  Administered 2015-03-01 (×3): 1 [drp] via OPHTHALMIC

## 2015-03-01 MED ORDER — EPINEPHRINE HCL 1 MG/ML IJ SOLN
INTRAMUSCULAR | Status: AC
  Filled 2015-03-01: qty 1

## 2015-03-01 MED ORDER — EPINEPHRINE HCL 1 MG/ML IJ SOLN
INTRAOCULAR | Status: DC | PRN
  Administered 2015-03-01: 17:00:00

## 2015-03-01 MED ORDER — SODIUM CHLORIDE 0.9 % IV SOLN: INTRAVENOUS | Status: DC

## 2015-03-01 MED ORDER — BSS PLUS IO SOLN
INTRAOCULAR | Status: AC
  Filled 2015-03-01: qty 500

## 2015-03-01 MED ORDER — TOBRAMYCIN-DEXAMETHASONE 0.3-0.1 % OP OINT
TOPICAL_OINTMENT | OPHTHALMIC | Status: AC
  Filled 2015-03-01: qty 3.5

## 2015-03-01 MED ORDER — DEXAMETHASONE SODIUM PHOSPHATE 10 MG/ML IJ SOLN
INTRAMUSCULAR | Status: AC
  Filled 2015-03-01: qty 1

## 2015-03-01 MED ORDER — DEXAMETHASONE SODIUM PHOSPHATE 10 MG/ML IJ SOLN
INTRAMUSCULAR | Status: DC | PRN
  Administered 2015-03-01: 10 mg

## 2015-03-01 MED ORDER — PROPOFOL 10 MG/ML IV BOLUS
INTRAVENOUS | Status: AC
  Filled 2015-03-01: qty 20

## 2015-03-01 MED ORDER — PHENYLEPHRINE HCL 2.5 % OP SOLN
1.0000 [drp] | OPHTHALMIC | Status: AC
  Administered 2015-03-01 (×3): 1 [drp] via OPHTHALMIC

## 2015-03-01 MED ORDER — OXYCODONE-ACETAMINOPHEN 5-325 MG PO TABS: 1.0000 | ORAL_TABLET | ORAL | Status: DC | PRN

## 2015-03-01 MED ORDER — ATROPINE SULFATE 1 % OP SOLN
OPHTHALMIC | Status: AC
  Filled 2015-03-01: qty 5

## 2015-03-01 MED ORDER — MIDAZOLAM HCL 5 MG/5ML IJ SOLN
INTRAMUSCULAR | Status: DC | PRN
  Administered 2015-03-01 (×2): 1 mg via INTRAVENOUS

## 2015-03-01 MED ORDER — TOBRAMYCIN-DEXAMETHASONE 0.3-0.1 % OP OINT
TOPICAL_OINTMENT | OPHTHALMIC | Status: DC | PRN
  Administered 2015-03-01: 1 via OPHTHALMIC

## 2015-03-01 MED ORDER — LIDOCAINE HCL 2 % IJ SOLN
INTRAMUSCULAR | Status: AC
  Filled 2015-03-01: qty 20

## 2015-03-01 MED ORDER — HYALURONIDASE HUMAN 150 UNIT/ML IJ SOLN
150.0000 [IU] | INTRAMUSCULAR | Status: DC
  Filled 2015-03-01: qty 1

## 2015-03-01 MED ORDER — 0.9 % SODIUM CHLORIDE (POUR BTL) OPTIME
TOPICAL | Status: DC | PRN
  Administered 2015-03-01: 1000 mL

## 2015-03-01 SURGICAL SUPPLY — 71 items
ACCESSORY FRAGMATOME (MISCELLANEOUS) IMPLANT
APL SRG 3 HI ABS STRL LF PLS (MISCELLANEOUS)
APPLICATOR DR MATTHEWS STRL (MISCELLANEOUS) IMPLANT
BLADE 10 SAFETY STRL DISP (BLADE) ×3 IMPLANT
BLADE MVR KNIFE 20G (BLADE) IMPLANT
CANNULA ANT CHAM MAIN (OPHTHALMIC RELATED) IMPLANT
CANNULA DUAL BORE 23G (CANNULA) IMPLANT
CANNULA VLV SOFT TIP 25G (OPHTHALMIC) ×1 IMPLANT
CANNULA VLV SOFT TIP 25GA (OPHTHALMIC) ×3 IMPLANT
CAUTERY EYE LOW TEMP 1300F FIN (OPHTHALMIC RELATED) IMPLANT
CLOSURE STERI-STRIP 1/2X4 (GAUZE/BANDAGES/DRESSINGS)
CLSR STERI-STRIP ANTIMIC 1/2X4 (GAUZE/BANDAGES/DRESSINGS) IMPLANT
CORDS BIPOLAR (ELECTRODE) IMPLANT
COVER MAYO STAND STRL (DRAPES) ×3 IMPLANT
DRAPE INCISE 51X51 W/FILM STRL (DRAPES) ×3 IMPLANT
DRAPE PROXIMA HALF (DRAPES) ×3 IMPLANT
DRAPE RETRACTOR (MISCELLANEOUS) ×3 IMPLANT
ERASER HMR WETFIELD 23G BP (MISCELLANEOUS) IMPLANT
FILTER BLUE MILLIPORE (MISCELLANEOUS) IMPLANT
FORCEPS ECKARDT ILM 25G SERR (OPHTHALMIC RELATED) IMPLANT
FORCEPS GRIESHABER ILM 25G A (INSTRUMENTS) IMPLANT
GAS AUTO FILL CONSTEL (OPHTHALMIC) ×3
GAS AUTO FILL CONSTELLATION (OPHTHALMIC) IMPLANT
GLOVE BIO SURGEON STRL SZ7.5 (GLOVE) ×4 IMPLANT
GLOVE BIOGEL PI IND STRL 7.0 (GLOVE) IMPLANT
GLOVE BIOGEL PI INDICATOR 7.0 (GLOVE) ×2
GLOVE SS BIOGEL STRL SZ 6.5 (GLOVE) IMPLANT
GLOVE SUPERSENSE BIOGEL SZ 6.5 (GLOVE) ×2
GLOVE SURG SS PI 7.0 STRL IVOR (GLOVE) ×2 IMPLANT
GOWN STRL REUS W/ TWL LRG LVL3 (GOWN DISPOSABLE) ×2 IMPLANT
GOWN STRL REUS W/TWL LRG LVL3 (GOWN DISPOSABLE) ×6
HANDLE PNEUMATIC FOR CONSTEL (OPHTHALMIC) ×2 IMPLANT
KIT BASIN OR (CUSTOM PROCEDURE TRAY) ×3 IMPLANT
KIT ROOM TURNOVER OR (KITS) ×3 IMPLANT
LENS BIOM SUPER VIEW SET DISP (OPHTHALMIC RELATED) ×3 IMPLANT
MICROPICK 25G (MISCELLANEOUS)
NDL 18GX1X1/2 (RX/OR ONLY) (NEEDLE) ×1 IMPLANT
NDL 25GX 5/8IN NON SAFETY (NEEDLE) ×1 IMPLANT
NDL FILTER BLUNT 18X1 1/2 (NEEDLE) IMPLANT
NDL HYPO 25GX1X1/2 BEV (NEEDLE) IMPLANT
NDL HYPO 30X.5 LL (NEEDLE) ×2 IMPLANT
NDL RETROBULBAR 25GX1.5 (NEEDLE) ×1 IMPLANT
NEEDLE 18GX1X1/2 (RX/OR ONLY) (NEEDLE) ×9 IMPLANT
NEEDLE 25GX 5/8IN NON SAFETY (NEEDLE) ×3 IMPLANT
NEEDLE FILTER BLUNT 18X 1/2SAF (NEEDLE) ×4
NEEDLE FILTER BLUNT 18X1 1/2 (NEEDLE) ×2 IMPLANT
NEEDLE HYPO 25GX1X1/2 BEV (NEEDLE) IMPLANT
NEEDLE HYPO 30X.5 LL (NEEDLE) IMPLANT
NEEDLE RETROBULBAR 25GX1.5 (NEEDLE) ×6 IMPLANT
NS IRRIG 1000ML POUR BTL (IV SOLUTION) ×3 IMPLANT
PACK FRAGMATOME (OPHTHALMIC) IMPLANT
PACK VITRECTOMY CUSTOM (CUSTOM PROCEDURE TRAY) ×3 IMPLANT
PAD ARMBOARD 7.5X6 YLW CONV (MISCELLANEOUS) ×4 IMPLANT
PAK PIK VITRECTOMY CVS 25GA (OPHTHALMIC) ×3 IMPLANT
PENCIL BIPOLAR 25GA STR DISP (OPHTHALMIC RELATED) IMPLANT
PICK MICROPICK 25G (MISCELLANEOUS) IMPLANT
PROBE LASER ILLUM FLEX CVD 25G (OPHTHALMIC) IMPLANT
ROLLS DENTAL (MISCELLANEOUS) IMPLANT
SCRAPER DIAMOND 25GA (OPHTHALMIC RELATED) IMPLANT
STOCKINETTE IMPERVIOUS 9X36 MD (GAUZE/BANDAGES/DRESSINGS) ×6 IMPLANT
STOCKINETTE IMPERVIOUS LG (DRAPES) ×6 IMPLANT
STOPCOCK 4 WAY LG BORE MALE ST (IV SETS) IMPLANT
SUT VICRYL 7 0 TG140 8 (SUTURE) IMPLANT
SYR 20CC LL (SYRINGE) ×3 IMPLANT
SYR 5ML LL (SYRINGE) ×3 IMPLANT
SYR TB 1ML LUER SLIP (SYRINGE) IMPLANT
SYRINGE 10CC LL (SYRINGE) IMPLANT
TOWEL OR 17X24 6PK STRL BLUE (TOWEL DISPOSABLE) ×6 IMPLANT
TUBE CONNECTING 12'X1/4 (SUCTIONS)
TUBE CONNECTING 12X1/4 (SUCTIONS) IMPLANT
WATER STERILE IRR 1000ML POUR (IV SOLUTION) ×3 IMPLANT

## 2015-03-01 NOTE — Anesthesia Postprocedure Evaluation (Signed)
  Anesthesia Post-op Note  Patient: Kelly Montes  Procedure(s) Performed: Procedure(s): PARS PLANA VITRECTOMY WITH 25 GAUGE WITH MEMBRANE PEEL (Right) GAS/FLUID EXCHANGE (Right)  Patient Location: PACU  Anesthesia Type:General  Level of Consciousness: awake  Airway and Oxygen Therapy: Patient Spontanous Breathing  Post-op Pain: mild  Post-op Assessment: Post-op Vital signs reviewed  Post-op Vital Signs: Reviewed  Last Vitals:  Filed Vitals:   03/01/15 1800  BP: 123/66  Pulse: 58  Temp: 37 C  Resp:     Complications: No apparent anesthesia complications

## 2015-03-01 NOTE — H&P (Signed)
Kelly Montes is an 64 y.o. female.   Chief Complaint: blurred vision ou  FBP:ZWCHENIDP with macular hole ou   Past Medical History  Diagnosis Date  . GERD (gastroesophageal reflux disease)   . Depression     Past Surgical History  Procedure Laterality Date  . Cesarean section    . Cholecystectomy    . Wrist surgery    . Colonoscopy    . Eye surgery      CATARACT REMOVAL  . Laparotomy      History reviewed. No pertinent family history. Social History:  reports that she has never smoked. She does not have any smokeless tobacco history on file. She reports that she does not drink alcohol or use illicit drugs.  Allergies:  Allergies  Allergen Reactions  . Sulfonamide Derivatives Nausea And Vomiting    Medications Prior to Admission  Medication Sig Dispense Refill  . escitalopram (LEXAPRO) 10 MG tablet Take 10 mg by mouth daily.    . naproxen sodium (ANAPROX) 220 MG tablet Take 220 mg by mouth 2 (two) times daily as needed (pain).    . RABEprazole (ACIPHEX) 20 MG tablet Take 20 mg by mouth daily.      No results found for this or any previous visit (from the past 48 hour(s)). No results found.  Review of Systems  Eyes: Positive for blurred vision.  All other systems reviewed and are negative.   Blood pressure 127/46, pulse 59, temperature 98.5 F (36.9 C), temperature source Oral, resp. rate 18, height 5' 6.5" (1.689 m), weight 95.936 kg (211 lb 8 oz), SpO2 99 %. Physical Exam  Eyes:       Assessment/Plan 1. Macular hole OU: PPV,ILMP, AGFX, OD today. OS to be repaired soon.  Kelly Montes B 03/01/2015, 3:40 PM

## 2015-03-01 NOTE — Transfer of Care (Signed)
Immediate Anesthesia Transfer of Care Note  Patient: Kelly Montes  Procedure(s) Performed: Procedure(s): PARS PLANA VITRECTOMY WITH 25 GAUGE WITH MEMBRANE PEEL (Right) GAS/FLUID EXCHANGE (Right)  Patient Location: PACU  Anesthesia Type:MAC  Level of Consciousness: awake, alert  and oriented  Airway & Oxygen Therapy: Patient Spontanous Breathing  Post-op Assessment: Report given to RN and Post -op Vital signs reviewed and stable  Post vital signs: Reviewed and stable  Last Vitals:  Filed Vitals:   03/01/15 1522  BP: 127/46  Pulse: 59  Temp: 36.9 C  Resp: 18    Complications: No apparent anesthesia complications

## 2015-03-01 NOTE — Discharge Instructions (Signed)
Per Dr. Baird Cancer bedside talk with family and patient discharge instructions as follows-       Leave patch on.      Take pain medication with food.      Follow up tomorrow morning at the office.      Stay face down only time not to be is when driving in a car, going to the bathroom, and for meals.      Leave green bracelet on.

## 2015-03-01 NOTE — Brief Op Note (Signed)
03/01/2015  5:53 PM  PATIENT:  Kelly Montes  64 y.o. 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  PROCEDURE:  Procedure(s): PARS PLANA VITRECTOMY WITH 25 GAUGE WITH MEMBRANE PEEL (Right) GAS/FLUID EXCHANGE (Right)  SURGEON:  Surgeon(s) and Role:    * Sherlynn Stalls, MD - Primary  PHYSICIAN ASSISTANT:   ASSISTANTS: none   ANESTHESIA:   MAC  EBL:  Total I/O In: 500 [I.V.:500] Out: -   BLOOD ADMINISTERED:none  DRAINS: none   LOCAL MEDICATIONS USED:  BUPIVICAINE   SPECIMEN:  No Specimen  DISPOSITION OF SPECIMEN:  N/A  COUNTS:  YES  TOURNIQUET:  * No tourniquets in log *  DICTATION: .Other Dictation: Dictation Number [id number]  PLAN OF CARE: Discharge to home after PACU  PATIENT DISPOSITION:  PACU - hemodynamically stable.   Delay start of Pharmacological VTE agent (>24hrs) due to surgical blood loss or risk of bleeding: not applicable

## 2015-03-01 NOTE — Anesthesia Preprocedure Evaluation (Addendum)
Anesthesia Evaluation  Patient identified by MRN, date of birth, ID band Patient awake    Reviewed: Allergy & Precautions, NPO status , Patient's Chart, lab work & pertinent test results  History of Anesthesia Complications Negative for: history of anesthetic complications  Airway Mallampati: II  TM Distance: >3 FB Neck ROM: Full    Dental  (+) Teeth Intact, Dental Advisory Given   Pulmonary  breath sounds clear to auscultation        Cardiovascular - anginanegative cardio ROS  Rhythm:Regular Rate:Normal     Neuro/Psych negative neurological ROS     GI/Hepatic Neg liver ROS, GERD-  Medicated and Controlled,  Endo/Other  negative endocrine ROS  Renal/GU negative Renal ROS     Musculoskeletal   Abdominal   Peds  Hematology negative hematology ROS (+)   Anesthesia Other Findings   Reproductive/Obstetrics                            Anesthesia Physical Anesthesia Plan  ASA: II  Anesthesia Plan: MAC   Post-op Pain Management:    Induction: Intravenous  Airway Management Planned: Nasal Cannula  Additional Equipment:   Intra-op Plan:   Post-operative Plan:   Informed Consent: I have reviewed the patients History and Physical, chart, labs and discussed the procedure including the risks, benefits and alternatives for the proposed anesthesia with the patient or authorized representative who has indicated his/her understanding and acceptance.   Dental advisory given  Plan Discussed with: CRNA and Surgeon  Anesthesia Plan Comments: (Plan routine monitors, MAC )        Anesthesia Quick Evaluation

## 2015-03-01 NOTE — Progress Notes (Signed)
Per Dr. Baird Cancer move timing of eye drops up give additional set of drops now and then in 4 minutes

## 2015-03-02 ENCOUNTER — Encounter (HOSPITAL_COMMUNITY): Payer: Self-pay | Admitting: Ophthalmology

## 2015-03-02 NOTE — Op Note (Signed)
NAME:  Kelly Montes, Kelly Montes NO.:  [id number]  MEDICAL RECORD NO.:  [id number]  LOCATION:  [facility]                         FACILITY:  [facility]  PHYSICIAN:  Corliss Parish, MD    DATE OF BIRTH:  1950/12/11  DATE OF PROCEDURE:  03/01/2015 DATE OF DISCHARGE:  03/01/2015                              OPERATIVE REPORT   SURGEON:  Corliss Parish, MD  PREOPERATIVE DIAGNOSIS:  Macular hole in the right eye.  POSTOPERATIVE DIAGNOSIS:  Macular hole in the right eye.  PROCEDURE:  Pars plana vitrectomy with 25-gauge, membrane peeling of the internal limiting membrane with air fluid exchange using 18% SF6 gas in the right eye.  COMPLICATIONS:  None.  FINDINGS:  There was a macular hole and epiretinal membrane in the right eye.  DESCRIPTION OF PROCEDURE:  The patient was identified in the preoperative holding area.  She was then taken to the operative room where she was sedated by the anesthesia team.  While under mild sedation, the right eye was anesthetized using retrobulbar block consisting of 1:1 mixture of 0.75% bupivacaine and 1% lidocaine with 150 units of Halonix.  After excellent akinesia and anesthesia was obtained, the right eye was prepped and draped in usual sterile fashion per ocular surgery including trimming of lashes.  A Lieberman speculum was placed between the right upper and lower eyelids for exposure.  25-gauge trocars were used to introduce transconjunctival cannulas in inferotemporal, superotemporal, and superonasal quadrants.  After the infusion cannula was confirmed to be in the vitreous cavity, it was placed in the inferior temporal cannula and turned on.  Next, the eye underwent a core vitrectomy with vitreous cutter and light pipe.  There was already a posterior  vitreous detachment present.  Vitreous was trended out to the periphery.  Next, the macular surface was stained with a dilute concentration of ICG dye.  This was left in place for 60 seconds  and then removed using vitrectomy.  Next, the contact lens was placed over the cornea for magnification of the macular surface.  A Finesse Loop was used to engage and create an edge of the internal limiting membrane.  Next, the internal limiting membrane was peeled gently off the macular surface in a nontraumatic fashion.  Epiretinal tissue was removed as well.  All membrane fragments were removed using the vitrectomy.  After this was completed, the eye was inspected with scleral depression.  There was no retinal tear or detachment seen by careful study of the first exam.  Therefore, the eye underwent a near fluid exchange using a soft-tipped extrusion cannula.  After the fluid exchange was completed, the eye was flushed with 18% concentration of SF6 gas.  Following this, the cannulas were removed from the sclerotomies.  Intraocular pressures were assessed and found to be physiologic.  The eye was then treated with subconjunctival injections of 15 mg of Ancef and 1 mg of dexamethasone.  The eye was then treated topically with one drop of 1% atropine and TobraDex ointment.  Speculum was removed.  The eyelids were cleaned and closed.  They were then patched and shielded.  The patient was then taken to the recovery in excellent condition having tolerated the procedure very well.  Corliss Parish, MD     JBS/MEDQ  D:  03/01/2015  T:  03/02/2015  Job:  [id number]

## 2015-03-03 NOTE — Discharge Summary (Signed)
Pt discharged home in excellent condition after uncomplicated repair of macular hole. She is to follow up in office with Dr. Baird Cancer the next day. Keep eye shielded. Face down positioning.

## 2015-03-31 ENCOUNTER — Other Ambulatory Visit: Payer: Self-pay | Admitting: Ophthalmology

## 2015-04-17 NOTE — Pre-Procedure Instructions (Signed)
Kelly Montes  04/17/2015      Your procedure is scheduled on June 20.  Report to [facility] Admitting at 12:45 A.M.  Call this number if you have problems the morning of surgery:  [phone]   Remember:  Do not eat food or drink liquids after midnight.  Take these medicines the morning of surgery with A SIP OF WATER Lexapro, Aciphex    STOP Naproxen today   STOP/ Do not take Aspirin, Aleve, Naproxen, Advil, Ibuprofen, Motrin, Vitamins, Herbs, or Supplements starting today    Do not wear jewelry, make-up or nail polish.  Do not wear lotions, powders, or perfumes.  You may wear deodorant.  Do not shave 48 hours prior to surgery.  Men may shave face and neck.  Do not bring valuables to the hospital.  [facility] is not responsible for any belongings or valuables.  Contacts, dentures or bridgework may not be worn into surgery.  Leave your suitcase in the car.  After surgery it may be brought to your room.  For patients admitted to the hospital, discharge time will be determined by your treatment team.  Patients discharged the day of surgery will not be allowed to drive home.   [facility] - Preparing for Surgery  Before surgery, you can play an important role.  Because skin is not sterile, your skin needs to be as free of germs as possible.  You can reduce the number of germs on you skin by washing with CHG (chlorahexidine gluconate) soap before surgery.  CHG is an antiseptic cleaner which kills germs and bonds with the skin to continue killing germs even after washing.  Please DO NOT use if you have an allergy to CHG or antibacterial soaps.  If your skin becomes reddened/irritated stop using the CHG and inform your nurse when you arrive at Short Stay.  Do not shave (including legs and underarms) for at least 48 hours prior to the first CHG shower.  You may shave your face.  Please follow these instructions carefully:   1.  Shower with CHG Soap the night  before surgery and the morning of Surgery.  2.  If you choose to wash your hair, wash your hair first as usual with your normal shampoo.  3.  After you shampoo, rinse your hair and body thoroughly to remove the shampoo.  4.  Use CHG as you would any other liquid soap.  You can apply CHG directly to the skin and wash gently with scrungie or a clean washcloth.  5.  Apply the CHG Soap to your body ONLY FROM THE NECK DOWN.  Do not use on open wounds or open sores.  Avoid contact with your eyes, ears, mouth and genitals (private parts).  Wash genitals (private parts) with your normal soap.  6.  Wash thoroughly, paying special attention to the area where your surgery will be performed.  7.  Thoroughly rinse your body with warm water from the neck down.  8.  DO NOT shower/wash with your normal soap after using and rinsing off the CHG Soap.  9.  Pat yourself dry with a clean towel.            10.  Wear clean pajamas.            11.  Place clean sheets on your bed the night of your first shower and do not sleep with pets.  Day of Surgery  Do not apply any lotions the  morning of surgery.  Please wear clean clothes to the hospital/surgery center.    Please read over the following fact sheets that you were given. Pain Booklet, Coughing and Deep Breathing and Surgical Site Infection Prevention

## 2015-04-19 ENCOUNTER — Encounter (HOSPITAL_COMMUNITY): Payer: Self-pay

## 2015-04-19 ENCOUNTER — Encounter (HOSPITAL_COMMUNITY)
Admission: RE | Admit: 2015-04-19 | Discharge: 2015-04-19 | Disposition: A | Discharge status: Home / Self Care | Payer: 59 | Source: Ambulatory Visit | Attending: Ophthalmology | Admitting: Ophthalmology

## 2015-04-19 DIAGNOSIS — H35342 Macular cyst, hole, or pseudohole, left eye: Secondary | ICD-10-CM | POA: Diagnosis not present

## 2015-04-19 HISTORY — DX: Macular cyst, hole, or pseudohole, unspecified eye: H35.349

## 2015-04-19 NOTE — Progress Notes (Signed)
Patient denies CP, Shob, cardiac test, or cardiologist. Reports PCP [facility] Internal Medicine Dr. Manuella Ghazi report sleep study in 2014 requested.

## 2015-04-19 NOTE — Progress Notes (Signed)
Kelly Montes with Dr. Baird Cancer office notified that orders need to be second signed.

## 2015-04-26 ENCOUNTER — Encounter (HOSPITAL_COMMUNITY)
Admission: RE | Disposition: A | Discharge status: Home / Self Care | Payer: Self-pay | Source: Ambulatory Visit | Attending: Ophthalmology

## 2015-04-26 ENCOUNTER — Ambulatory Visit (HOSPITAL_COMMUNITY): Payer: 59 | Admitting: Anesthesiology

## 2015-04-26 ENCOUNTER — Ambulatory Visit (HOSPITAL_COMMUNITY)
Admission: RE | Admit: 2015-04-26 | Discharge: 2015-04-26 | Disposition: A | Discharge status: Home / Self Care | Payer: 59 | Source: Ambulatory Visit | Attending: Ophthalmology | Admitting: Ophthalmology

## 2015-04-26 DIAGNOSIS — H35342 Macular cyst, hole, or pseudohole, left eye: Secondary | ICD-10-CM | POA: Diagnosis not present

## 2015-04-26 HISTORY — PX: GAS/FLUID EXCHANGE: SHX5334

## 2015-04-26 HISTORY — PX: PARS PLANA VITRECTOMY: SHX2166

## 2015-04-26 HISTORY — PX: MEMBRANE PEEL: SHX5967

## 2015-04-26 HISTORY — PX: PHOTOCOAGULATION WITH LASER: SHX6027

## 2015-04-26 SURGERY — PARS PLANA VITRECTOMY WITH 25 GAUGE
Anesthesia: Monitor Anesthesia Care | Site: Eye | Laterality: Left

## 2015-04-26 MED ORDER — BSS PLUS IO SOLN
INTRAOCULAR | Status: AC
  Filled 2015-04-26: qty 500

## 2015-04-26 MED ORDER — TOBRAMYCIN-DEXAMETHASONE 0.3-0.1 % OP OINT
TOPICAL_OINTMENT | OPHTHALMIC | Status: AC
  Filled 2015-04-26: qty 3.5

## 2015-04-26 MED ORDER — SODIUM CHLORIDE 0.9 % IJ SOLN
INTRAMUSCULAR | Status: AC
  Filled 2015-04-26: qty 10

## 2015-04-26 MED ORDER — EPINEPHRINE HCL 1 MG/ML IJ SOLN
INTRAOCULAR | Status: DC | PRN
  Administered 2015-04-26: 17:00:00

## 2015-04-26 MED ORDER — GLYCOPYRROLATE 0.2 MG/ML IJ SOLN
INTRAMUSCULAR | Status: AC
  Filled 2015-04-26: qty 2

## 2015-04-26 MED ORDER — NEOSTIGMINE METHYLSULFATE 10 MG/10ML IV SOLN
INTRAVENOUS | Status: AC
  Filled 2015-04-26: qty 2

## 2015-04-26 MED ORDER — ROCURONIUM BROMIDE 50 MG/5ML IV SOLN
INTRAVENOUS | Status: AC
  Filled 2015-04-26: qty 1

## 2015-04-26 MED ORDER — BUPIVACAINE HCL (PF) 0.75 % IJ SOLN
INTRAMUSCULAR | Status: AC
  Filled 2015-04-26: qty 10

## 2015-04-26 MED ORDER — PROPOFOL 10 MG/ML IV BOLUS
INTRAVENOUS | Status: DC | PRN
  Administered 2015-04-26: 50 mg via INTRAVENOUS

## 2015-04-26 MED ORDER — LIDOCAINE HCL (CARDIAC) 20 MG/ML IV SOLN
INTRAVENOUS | Status: DC | PRN
  Administered 2015-04-26: 30 mg via INTRAVENOUS

## 2015-04-26 MED ORDER — 0.9 % SODIUM CHLORIDE (POUR BTL) OPTIME
TOPICAL | Status: DC | PRN
  Administered 2015-04-26: 1000 mL

## 2015-04-26 MED ORDER — DEXAMETHASONE SODIUM PHOSPHATE 10 MG/ML IJ SOLN
INTRAMUSCULAR | Status: AC
  Filled 2015-04-26: qty 1

## 2015-04-26 MED ORDER — FENTANYL CITRATE (PF) 100 MCG/2ML IJ SOLN: 25.0000 ug | INTRAMUSCULAR | Status: DC | PRN

## 2015-04-26 MED ORDER — EPINEPHRINE HCL 1 MG/ML IJ SOLN
INTRAMUSCULAR | Status: AC
  Filled 2015-04-26: qty 1

## 2015-04-26 MED ORDER — HYALURONIDASE HUMAN 150 UNIT/ML IJ SOLN
INTRAMUSCULAR | Status: AC
  Filled 2015-04-26: qty 1

## 2015-04-26 MED ORDER — DEXAMETHASONE SODIUM PHOSPHATE 10 MG/ML IJ SOLN
INTRAMUSCULAR | Status: DC | PRN
  Administered 2015-04-26: 10 mg

## 2015-04-26 MED ORDER — SODIUM CHLORIDE 0.9 % IV SOLN
INTRAVENOUS | Status: DC
  Administered 2015-04-26: 14:00:00 via INTRAVENOUS

## 2015-04-26 MED ORDER — MIDAZOLAM HCL 2 MG/2ML IJ SOLN
INTRAMUSCULAR | Status: AC
  Filled 2015-04-26: qty 2

## 2015-04-26 MED ORDER — EPHEDRINE SULFATE 50 MG/ML IJ SOLN
INTRAMUSCULAR | Status: AC
  Filled 2015-04-26: qty 1

## 2015-04-26 MED ORDER — ATROPINE SULFATE 1 % OP SOLN
OPHTHALMIC | Status: AC
Start: 2015-04-26 — End: 2015-04-26
  Administered 2015-04-26: 1 [drp] via OPHTHALMIC
  Filled 2015-04-26: qty 5

## 2015-04-26 MED ORDER — PHENYLEPHRINE HCL 2.5 % OP SOLN
1.0000 [drp] | OPHTHALMIC | Status: DC
  Administered 2015-04-26 (×3): 1 [drp] via OPHTHALMIC

## 2015-04-26 MED ORDER — BSS IO SOLN
INTRAOCULAR | Status: AC
  Filled 2015-04-26: qty 15

## 2015-04-26 MED ORDER — CEFAZOLIN SUBCONJUNCTIVAL INJECTION 100 MG/0.5 ML
200.0000 mg | INJECTION | SUBCONJUNCTIVAL | Status: AC
  Administered 2015-04-26: 200 mg via SUBCONJUNCTIVAL
  Filled 2015-04-26: qty 5

## 2015-04-26 MED ORDER — MIDAZOLAM HCL 5 MG/5ML IJ SOLN
INTRAMUSCULAR | Status: DC | PRN
  Administered 2015-04-26: 2 mg via INTRAVENOUS

## 2015-04-26 MED ORDER — ATROPINE SULFATE 1 % OP SOLN
1.0000 [drp] | OPHTHALMIC | Status: DC
  Administered 2015-04-26 (×3): 1 [drp] via OPHTHALMIC

## 2015-04-26 MED ORDER — PROPOFOL 10 MG/ML IV BOLUS
INTRAVENOUS | Status: AC
  Filled 2015-04-26: qty 20

## 2015-04-26 MED ORDER — HYPROMELLOSE (GONIOSCOPIC) 2.5 % OP SOLN
OPHTHALMIC | Status: AC
  Filled 2015-04-26: qty 15

## 2015-04-26 MED ORDER — TOBRAMYCIN-DEXAMETHASONE 0.3-0.1 % OP OINT
TOPICAL_OINTMENT | OPHTHALMIC | Status: DC | PRN
  Administered 2015-04-26: 1 via OPHTHALMIC

## 2015-04-26 MED ORDER — ATROPINE SULFATE 1 % OP SOLN
OPHTHALMIC | Status: AC
  Filled 2015-04-26: qty 5

## 2015-04-26 MED ORDER — INDOCYANINE GREEN 25 MG IV SOLR
10.0000 mg | INTRAVENOUS | Status: DC | PRN
  Administered 2015-04-26: 10 mg

## 2015-04-26 MED ORDER — BSS IO SOLN
INTRAOCULAR | Status: DC | PRN
  Administered 2015-04-26: 15 mL via INTRAOCULAR

## 2015-04-26 MED ORDER — HYPROMELLOSE (GONIOSCOPIC) 2.5 % OP SOLN
OPHTHALMIC | Status: DC | PRN
  Administered 2015-04-26: 10 [drp] via OPHTHALMIC

## 2015-04-26 MED ORDER — LIDOCAINE HCL 2 % IJ SOLN
INTRAMUSCULAR | Status: AC
  Filled 2015-04-26: qty 20

## 2015-04-26 MED ORDER — LIDOCAINE HCL (CARDIAC) 20 MG/ML IV SOLN
INTRAVENOUS | Status: AC
  Filled 2015-04-26: qty 15

## 2015-04-26 MED ORDER — PHENYLEPHRINE HCL 2.5 % OP SOLN
OPHTHALMIC | Status: AC
  Administered 2015-04-26: 1 [drp] via OPHTHALMIC
  Filled 2015-04-26: qty 2

## 2015-04-26 MED ORDER — LIDOCAINE HCL 2 % IJ SOLN
INTRAMUSCULAR | Status: DC | PRN
  Administered 2015-04-26: 6 mL via RETROBULBAR

## 2015-04-26 SURGICAL SUPPLY — 63 items
BLADE MVR KNIFE 20G (BLADE) IMPLANT
CANNULA ANT CHAM MAIN (OPHTHALMIC RELATED) IMPLANT
CANNULA DUAL BORE 23G (CANNULA) IMPLANT
CANNULA VLV SOFT TIP 25G (OPHTHALMIC) ×1 IMPLANT
CANNULA VLV SOFT TIP 25GA (OPHTHALMIC) ×3 IMPLANT
CAUTERY EYE LOW TEMP 1300F FIN (OPHTHALMIC RELATED) IMPLANT
CLOSURE STERI-STRIP 1/2X4 (GAUZE/BANDAGES/DRESSINGS)
CLSR STERI-STRIP ANTIMIC 1/2X4 (GAUZE/BANDAGES/DRESSINGS) IMPLANT
CORDS BIPOLAR (ELECTRODE) IMPLANT
COVER MAYO STAND STRL (DRAPES) ×1 IMPLANT
DRAPE INCISE 51X51 W/FILM STRL (DRAPES) ×3 IMPLANT
DRAPE PROXIMA HALF (DRAPES) ×3 IMPLANT
DRAPE RETRACTOR (MISCELLANEOUS) ×3 IMPLANT
ERASER HMR WETFIELD 23G BP (MISCELLANEOUS) IMPLANT
FILTER BLUE MILLIPORE (MISCELLANEOUS) IMPLANT
FORCEPS ECKARDT ILM 25G SERR (OPHTHALMIC RELATED) IMPLANT
FORCEPS GRIESHABER ILM 25G A (INSTRUMENTS) ×2 IMPLANT
GAS AUTO FILL CONSTEL (OPHTHALMIC) ×3
GAS AUTO FILL CONSTELLATION (OPHTHALMIC) IMPLANT
GAS IO SF6 125GM ISPAN CNSTL (MEDICAL GASES) IMPLANT
GAS TANK CONSTELL (MEDICAL GASES) ×3
GLOVE BIO SURGEON STRL SZ7.5 (GLOVE) ×3 IMPLANT
GLOVE SURG SS PI 7.0 STRL IVOR (GLOVE) ×2 IMPLANT
GOWN STRL REUS W/ TWL LRG LVL3 (GOWN DISPOSABLE) ×2 IMPLANT
GOWN STRL REUS W/TWL LRG LVL3 (GOWN DISPOSABLE) ×6
HANDLE PNEUMATIC FOR CONSTEL (OPHTHALMIC) ×2 IMPLANT
KIT BASIN OR (CUSTOM PROCEDURE TRAY) ×3 IMPLANT
KIT ROOM TURNOVER OR (KITS) ×3 IMPLANT
LENS BIOM SUPER VIEW SET DISP (OPHTHALMIC RELATED) ×3 IMPLANT
LOOP FINESSE 25 GA (MISCELLANEOUS) ×2 IMPLANT
MICROPICK 25G (MISCELLANEOUS)
NDL 18GX1X1/2 (RX/OR ONLY) (NEEDLE) ×1 IMPLANT
NDL 25GX 5/8IN NON SAFETY (NEEDLE) ×1 IMPLANT
NDL FILTER BLUNT 18X1 1/2 (NEEDLE) IMPLANT
NDL HYPO 25GX1X1/2 BEV (NEEDLE) IMPLANT
NDL HYPO 30X.5 LL (NEEDLE) ×2 IMPLANT
NDL RETROBULBAR 25GX1.5 (NEEDLE) ×1 IMPLANT
NEEDLE 18GX1X1/2 (RX/OR ONLY) (NEEDLE) ×6 IMPLANT
NEEDLE 25GX 5/8IN NON SAFETY (NEEDLE) IMPLANT
NEEDLE FILTER BLUNT 18X 1/2SAF (NEEDLE) ×4
NEEDLE FILTER BLUNT 18X1 1/2 (NEEDLE) ×2 IMPLANT
NEEDLE HYPO 25GX1X1/2 BEV (NEEDLE) ×3 IMPLANT
NEEDLE HYPO 30X.5 LL (NEEDLE) IMPLANT
NEEDLE RETROBULBAR 25GX1.5 (NEEDLE) ×3 IMPLANT
NS IRRIG 1000ML POUR BTL (IV SOLUTION) ×3 IMPLANT
PAD ARMBOARD 7.5X6 YLW CONV (MISCELLANEOUS) ×4 IMPLANT
PAK PIK VITRECTOMY CVS 25GA (OPHTHALMIC) ×3 IMPLANT
PENCIL BIPOLAR 25GA STR DISP (OPHTHALMIC RELATED) IMPLANT
PICK MICROPICK 25G (MISCELLANEOUS) IMPLANT
PROBE LASER ILLUM FLEX CVD 25G (OPHTHALMIC) ×2 IMPLANT
ROLLS DENTAL (MISCELLANEOUS) IMPLANT
SCRAPER DIAMOND 25GA (OPHTHALMIC RELATED) IMPLANT
STOCKINETTE IMPERVIOUS 9X36 MD (GAUZE/BANDAGES/DRESSINGS) ×6 IMPLANT
STOPCOCK 4 WAY LG BORE MALE ST (IV SETS) IMPLANT
SUT VICRYL 7 0 TG140 8 (SUTURE) IMPLANT
SYR 20CC LL (SYRINGE) ×1 IMPLANT
SYR 5ML LL (SYRINGE) ×1 IMPLANT
SYR TB 1ML LUER SLIP (SYRINGE) ×2 IMPLANT
SYRINGE 10CC LL (SYRINGE) ×4 IMPLANT
TOWEL OR 17X24 6PK STRL BLUE (TOWEL DISPOSABLE) ×6 IMPLANT
TUBE CONNECTING 12'X1/4 (SUCTIONS)
TUBE CONNECTING 12X1/4 (SUCTIONS) IMPLANT
WATER STERILE IRR 1000ML POUR (IV SOLUTION) ×3 IMPLANT

## 2015-04-26 NOTE — Anesthesia Postprocedure Evaluation (Signed)
  Anesthesia Post-op Note  Patient: Kelly Montes  Procedure(s) Performed: Procedure(s): PARS PLANA VITRECTOMY WITH 25 GAUGE (Left) MEMBRANE PEEL (Left) GAS/FLUID EXCHANGE (Left) PHOTOCOAGULATION WITH LASER (Left)  Patient Location: PACU  Anesthesia Type: MAC  Level of Consciousness: awake and alert   Airway and Oxygen Therapy: Patient Spontanous Breathing  Post-op Pain: none  Post-op Assessment: Post-op Vital signs reviewed, Patient's Cardiovascular Status Stable and Respiratory Function Stable  Post-op Vital Signs: Reviewed  Filed Vitals:   04/26/15 1805  BP: 126/64  Pulse: 71  Temp:   Resp: 16    Complications: No apparent anesthesia complications

## 2015-04-26 NOTE — Transfer of Care (Signed)
Immediate Anesthesia Transfer of Care Note  Patient: Kelly Montes  Procedure(s) Performed: Procedure(s): PARS PLANA VITRECTOMY WITH 25 GAUGE (Left) MEMBRANE PEEL (Left) GAS/FLUID EXCHANGE (Left) PHOTOCOAGULATION WITH LASER (Left)  Patient Location: PACU  Anesthesia Type:MAC  Level of Consciousness: awake, alert  and oriented  Airway & Oxygen Therapy: Patient Spontanous Breathing  Post-op Assessment: Report given to RN, Post -op Vital signs reviewed and stable and Patient moving all extremities X 4  Post vital signs: Reviewed and stable  Last Vitals:  Filed Vitals:   04/26/15 1752  BP: 126/66  Pulse: 69  Temp: 37 C  Resp: 18    Complications: No apparent anesthesia complications

## 2015-04-26 NOTE — H&P (Signed)
Kelly Montes is an 64 y.o. female.   Chief Complaint: poor vision os HPI: macular os  Past Medical History  Diagnosis Date  . GERD (gastroesophageal reflux disease)   . Depression   . Macular hole     Past Surgical History  Procedure Laterality Date  . Cesarean section    . Cholecystectomy    . Wrist surgery    . Colonoscopy    . Eye surgery      CATARACT REMOVAL  . Laparotomy    . Pars plana vitrectomy Right 03/01/2015    Procedure: PARS PLANA VITRECTOMY WITH 25 GAUGE WITH MEMBRANE PEEL;  Surgeon: Sherlynn Stalls, MD;  Location: [facility];  Service: Ophthalmology;  Laterality: Right;  . Gas/fluid exchange Right 03/01/2015    Procedure: GAS/FLUID EXCHANGE;  Surgeon: Sherlynn Stalls, MD;  Location: [facility];  Service: Ophthalmology;  Laterality: Right;    No family history on file. Social History:  reports that she has never smoked. She does not have any smokeless tobacco history on file. She reports that she does not drink alcohol or use illicit drugs.  Allergies:  Allergies  Allergen Reactions  . Sulfonamide Derivatives Nausea And Vomiting    Medications Prior to Admission  Medication Sig Dispense Refill  . escitalopram (LEXAPRO) 10 MG tablet Take 10 mg by mouth daily.    . naproxen sodium (ANAPROX) 220 MG tablet Take 440 mg by mouth 2 (two) times daily as needed (pain).     . RABEprazole (ACIPHEX) 20 MG tablet Take 20 mg by mouth daily.    Marland Kitchen oxyCODONE-acetaminophen (ROXICET) 5-325 MG per tablet Take 1 tablet by mouth every 4 (four) hours as needed. (Patient not taking: Reported on 04/16/2015) 10 tablet 0    No results found for this or any previous visit (from the past 48 hour(s)). No results found.  Review of Systems  Eyes: Positive for blurred vision.  All other systems reviewed and are negative.   Blood pressure 135/81, pulse 69, temperature 98.6 F (37 C), temperature source Oral, resp. rate 20, weight 93.441 kg (206 lb), SpO2 97 %. Physical Exam  Constitutional:  She appears well-developed and well-nourished.  Eyes: Conjunctivae, EOM and lids are normal. Pupils are equal, round, and reactive to light.       Assessment/Plan 1. MH OS: PPV/ILMP/AFX/GFX OS  Cylus Douville B 04/26/2015, 2:36 PM

## 2015-04-26 NOTE — Anesthesia Preprocedure Evaluation (Addendum)
Anesthesia Evaluation  Patient identified by MRN, date of birth, ID band Patient awake    Reviewed: Allergy & Precautions, H&P , NPO status , Patient's Chart, lab work & pertinent test results  Airway Mallampati: III  TM Distance: >3 FB Neck ROM: Full    Dental no notable dental hx. (+) Teeth Intact, Dental Advisory Given   Pulmonary neg pulmonary ROS,  breath sounds clear to auscultation  Pulmonary exam normal       Cardiovascular negative cardio ROS  Rhythm:Regular Rate:Normal     Neuro/Psych Depression negative neurological ROS     GI/Hepatic Neg liver ROS, GERD-  Medicated and Controlled,  Endo/Other  negative endocrine ROS  Renal/GU negative Renal ROS  negative genitourinary   Musculoskeletal   Abdominal   Peds  Hematology negative hematology ROS (+)   Anesthesia Other Findings   Reproductive/Obstetrics negative OB ROS                            Anesthesia Physical Anesthesia Plan  ASA: II  Anesthesia Plan: MAC   Post-op Pain Management:    Induction: Intravenous  Airway Management Planned: Nasal Cannula  Additional Equipment:   Intra-op Plan:   Post-operative Plan:   Informed Consent: I have reviewed the patients History and Physical, chart, labs and discussed the procedure including the risks, benefits and alternatives for the proposed anesthesia with the patient or authorized representative who has indicated his/her understanding and acceptance.   Dental advisory given  Plan Discussed with: CRNA  Anesthesia Plan Comments:         Anesthesia Quick Evaluation

## 2015-04-26 NOTE — Discharge Instructions (Signed)
Keep patch on until follow up appointment Return to office tomorrow morning To be face down when home and until you go for your follow up appointment Take it easy today. Resume all home medication

## 2015-04-26 NOTE — Brief Op Note (Signed)
04/26/2015  5:49 PM  PATIENT:  Kelly Montes  64 y.o. female  PRE-OPERATIVE DIAGNOSIS:  macular hole  POST-OPERATIVE DIAGNOSIS:  macular hole  PROCEDURE:  Procedure(s): PARS PLANA VITRECTOMY WITH 25 GAUGE (Left) MEMBRANE PEEL (Left) GAS/FLUID EXCHANGE (Left) PHOTOCOAGULATION WITH LASER (Left)  SURGEON:  Surgeon(s) and Role:    * Sherlynn Stalls, MD - Primary  PHYSICIAN ASSISTANT:   ASSISTANTS: none   ANESTHESIA:   MAC  EBL:     BLOOD ADMINISTERED:none  DRAINS: none   LOCAL MEDICATIONS USED:  BUPIVICAINE   SPECIMEN:  No Specimen  DISPOSITION OF SPECIMEN:  N/A  COUNTS:  YES  TOURNIQUET:  * No tourniquets in log *  DICTATION: .Other Dictation: Dictation Number [id number]  PLAN OF CARE: Discharge to home after PACU  PATIENT DISPOSITION:  PACU - hemodynamically stable.   Delay start of Pharmacological VTE agent (>24hrs) due to surgical blood loss or risk of bleeding: not applicable

## 2015-04-27 ENCOUNTER — Encounter (HOSPITAL_COMMUNITY): Payer: Self-pay | Admitting: Ophthalmology

## 2015-04-27 NOTE — Op Note (Signed)
NAME:  Kelly Montes, Kelly Montes NO.:  [id number]  MEDICAL RECORD NO.:  [id number]  LOCATION:  [facility]                         FACILITY:  [facility]  PHYSICIAN:  Corliss Parish, MD    DATE OF BIRTH:  Mar 25, 1951  DATE OF PROCEDURE: DATE OF DISCHARGE:  04/26/2015                              OPERATIVE REPORT   SURGEON:  Corene Cornea B. Baird Cancer, M.D.  ANESTHESIA:  Local MAC with retrobulbar injection consisting of a 1:1 mixture of 0.75% bupivacaine and 1% lidocaine with 150 units of Hylenex.  PREOPERATIVE DIAGNOSIS:  Macular hole, left eye.  POSTOPERATIVE DIAGNOSIS:  Macular hole, left eye.  OPERATION PROCEDURE:  A pars plana vitrectomy with membrane peeling of the internal membrane of the left eye, air-fluid exchange and gas-fluid exchange in the left eye using 18% SF6 gas.  COMPLICATIONS:  None.  FINDINGS:  There is a macular hole in the left eye.  DESCRIPTION OF PROCEDURE:  Ms. Bittinger was identified in the preoperative holding area.  She was then taken to the operating room where she was sedated by the Anesthesia team.  At that point in time, the left eye was anesthetized using retrobulbar block consisting of 1:1 mixture of 0.75% bupivacaine and 1% lidocaine and 150 units of Hylenex. The needle was removed after the block was placed.  At that point in time, there was excellent anesthesia and akinesia of the left eye.  The left eye was then prepped and draped in usual sterile fashion for ocular surgery including trimming of lash.  A Lieberman speculum was placed between the left upper and lower eyelids for exposure.  The 25-gauge trocars were used to introduce transconjunctival cannulas in the superotemporal, inferotemporal and supranasal quadrants.  These cannulas were left in place and the trocars were removed.  An infusion cannula was placed in the inferotemporal cannula position.  This was then confirmed to be in the vitreous cavity prior to its use.  The eye  then underwent a core vitrectomy with a 25-gauge vitrector and light pipe. Vitreous was hollowed out.  At that point in time, the posterior hyaloid was engaged and elevated using the 25-gauge vitrector.  The posterior hyaloid was then removed out to the peripheral vitreous cavity.  Once this was completed, the macular surface was stained with a diluted concentration of ICG dye.  This was left in place for 60 seconds and then removed using a vitrector.  Next, the internal limiting membrane was engaged using an ILM forceps.  This was used to pinch and peel the internal limiting membrane.  A very nice atraumatic peel to the internal limiting membrane was performed without difficulty.  After this was completed, all edges of the internal limiting membrane were freed from the macular hole.  The macular membranes were then removed from the vitreous cavity using a vitrector.  Following this, the eye was inspected with scleral depression.  No definitive retinal breaks were seen.  Endolaser photocoagulation was placed along an area of vitreoretinal adhesion in the inferior periphery to reduce the risk of retinal detachment postoperatively.  After this was completed, a soft- tipped extrusion cannula used to perform an air-fluid exchange.  After that was completed, a gas-air exchange was performed using 18%  SF6 gas. Once this was finished, the cannulas were removed from the sclerotomies. The sclerotomies were inspected and found to be airtight.  The eye was then treated with subconjunctival injection of 15 mg of Ancef to 1 mg of dexamethasone.  Eye was then treated topically with 1 drop of 1% atropine and TobraDex ointment.  The speculum was removed.  The eyelids were cleaned and closed and then patched and shielded.  The patient was then taken to the recovery in excellent condition having tolerated the procedure very well.     Corliss Parish, MD     JBS/MEDQ  D:  04/26/2015  T:  04/27/2015   Job:  [id number]

## 2015-11-05 ENCOUNTER — Encounter (HOSPITAL_COMMUNITY): Payer: Self-pay | Admitting: Physician Assistant

## 2015-11-05 DIAGNOSIS — M1711 Unilateral primary osteoarthritis, right knee: Secondary | ICD-10-CM | POA: Diagnosis present

## 2015-11-05 NOTE — H&P (Signed)
TOTAL KNEE ADMISSION H&P  Patient is being admitted for right total knee arthroplasty.  Subjective:  Chief Complaint:right knee pain.  HPI: Kelly Montes, 64 y.o. female, has a history of pain and functional disability in the right knee due to arthritis and has failed non-surgical conservative treatments for greater than 12 weeks to includeNSAID's and/or analgesics, corticosteriod injections, viscosupplementation injections, flexibility and strengthening excercises, supervised PT with diminished ADL's post treatment, weight reduction as appropriate and activity modification.  Onset of symptoms was gradual, starting 10 years ago with gradually worsening course since that time. The patient noted no past surgery on the right knee(s).  Patient currently rates pain in the right knee(s) at 9 out of 10 with activity. Patient has night pain, worsening of pain with activity and weight bearing, pain that interferes with activities of daily living, crepitus and joint swelling.  Patient has evidence of subchondral sclerosis, periarticular osteophytes and joint space narrowing by imaging studies.There is no active infection.  Patient Active Problem List   Diagnosis Date Noted  . Primary localized osteoarthritis of right knee   . Liver function abnormality 05/10/2010  . Depression 04/08/2010  . GERD 04/08/2010  . Disorder of bone and cartilage 04/08/2010   Past Medical History  Diagnosis Date  . GERD (gastroesophageal reflux disease)   . Depression   . Macular hole   . Primary localized osteoarthritis of right knee     Past Surgical History  Procedure Laterality Date  . Cesarean section    . Cholecystectomy    . Wrist surgery    . Colonoscopy    . Eye surgery      CATARACT REMOVAL  . Laparotomy    . Pars plana vitrectomy Right 03/01/2015    Procedure: PARS PLANA VITRECTOMY WITH 25 GAUGE WITH MEMBRANE PEEL;  Surgeon: Sherlynn Stalls, MD;  Location: [facility];  Service: Ophthalmology;  Laterality:  Right;  . Gas/fluid exchange Right 03/01/2015    Procedure: GAS/FLUID EXCHANGE;  Surgeon: Sherlynn Stalls, MD;  Location: [facility];  Service: Ophthalmology;  Laterality: Right;  . Pars plana vitrectomy Left 04/26/2015    Procedure: PARS PLANA VITRECTOMY WITH 25 GAUGE;  Surgeon: Sherlynn Stalls, MD;  Location: [facility];  Service: Ophthalmology;  Laterality: Left;  Marland Kitchen Membrane peel Left 04/26/2015    Procedure: MEMBRANE PEEL;  Surgeon: Sherlynn Stalls, MD;  Location: [facility];  Service: Ophthalmology;  Laterality: Left;  . Gas/fluid exchange Left 04/26/2015    Procedure: GAS/FLUID EXCHANGE;  Surgeon: Sherlynn Stalls, MD;  Location: [facility];  Service: Ophthalmology;  Laterality: Left;  . Photocoagulation with laser Left 04/26/2015    Procedure: PHOTOCOAGULATION WITH LASER;  Surgeon: Sherlynn Stalls, MD;  Location: [facility];  Service: Ophthalmology;  Laterality: Left;    No prescriptions prior to admission   Allergies  Allergen Reactions  . Sulfonamide Derivatives Nausea And Vomiting    Social History  Substance Use Topics  . Smoking status: Never Smoker   . Smokeless tobacco: Not on file  . Alcohol Use: No    No family history on file.   Review of Systems  Constitutional: Negative.   HENT: Negative.   Eyes: Negative.   Respiratory: Negative.   Cardiovascular: Negative.   Gastrointestinal: Negative.   Genitourinary: Negative.   Musculoskeletal: Positive for back pain and joint pain.  Skin: Negative.   Neurological: Negative.     Objective:  Physical Exam  Constitutional: She is oriented to person, place, and time. She appears well-developed and well-nourished.  HENT:  Head:  Normocephalic and atraumatic.  Mouth/Throat: Oropharynx is clear and moist.  Eyes: Conjunctivae are normal. Pupils are equal, round, and reactive to light.  Neck: Neck supple.  Cardiovascular: Normal rate and regular rhythm.   Respiratory: Effort normal and breath sounds normal.  GI: Soft.  Genitourinary:  Not pertinent to  current symptomatology therefore not examined.  Musculoskeletal:  Examination of her right knee reveals diffuse pain.  1+ crepitation.  1+ synovitis.  Range of motion 0-110 degrees.  Knee is stable with normal patella tracking.  Examination of her left knee reveals pain with 1+ crepitation.  Full range of motion.  Knee is stable with normal patella tracking.  Vascular exam: Pulses are 2+ and symmetric.    Neurological: She is alert and oriented to person, place, and time.  Skin: Skin is warm and dry.  Psychiatric: Her behavior is normal.    Vital signs in last 24 hours:    Labs:   Estimated body mass index is 32.90 kg/(m^2) as calculated from the following:   Height as of 04/19/15: 5' 6.5" (1.689 m).   Weight as of 04/19/15: 93.849 kg (206 lb 14.4 oz).   Imaging Review Plain radiographs demonstrate severe degenerative joint disease of the right knee(s). The overall alignment issignificant varus. The bone quality appears to be good for age and reported activity level.  Assessment/Plan:  End stage arthritis, right knee Principal Problem:   Primary localized osteoarthritis of right knee Active Problems:   Depression   GERD   Disorder of bone and cartilage   Liver function abnormality    The patient history, physical examination, clinical judgment of the provider and imaging studies are consistent with end stage degenerative joint disease of the right knee(s) and total knee arthroplasty is deemed medically necessary. The treatment options including medical management, injection therapy arthroscopy and arthroplasty were discussed at length. The risks and benefits of total knee arthroplasty were presented and reviewed. The risks due to aseptic loosening, infection, stiffness, patella tracking problems, thromboembolic complications and other imponderables were discussed. The patient acknowledged the explanation, agreed to proceed with the plan and consent was signed. Patient is being  admitted for inpatient treatment for surgery, pain control, PT, OT, prophylactic antibiotics, VTE prophylaxis, progressive ambulation and ADL's and discharge planning. The patient is planning to be discharged home with home health services

## 2015-11-11 NOTE — Pre-Procedure Instructions (Signed)
Kelly Montes  11/11/2015      [facility] - [place], [place] - [place] [address] Phone: [phone] Fax: [phone]    Your procedure is scheduled on Mon, Jan 16 @ 7:15 AM  Report to [facility] at 5:30 AM  Call this number if you have problems the morning of surgery:  [phone]   Remember:  Do not eat food or drink liquids after midnight.  Take these medicines the morning of surgery with A SIP OF WATER Lexapro(Escitalopram) and Aciphex(Rabeprazole)               Stop taking your Naproxen a week prior to surgery. No Goody's,BC's,Aspirin,Ibuprofen,Motrin,Advil,Fish Oil,or any Herbal Medications.   Do not wear jewelry, make-up or nail polish.  Do not wear lotions, powders, or perfumes.  You may wear deodorant.  Do not shave 48 hours prior to surgery.    Do not bring valuables to the hospital.  [facility] is not responsible for any belongings or valuables.  Contacts, dentures or bridgework may not be worn into surgery.  Leave your suitcase in the car.  After surgery it may be brought to your room.  For patients admitted to the hospital, discharge time will be determined by your treatment team.  Patients discharged the day of surgery will not be allowed to drive home.   Special instructions:   [facility] - Preparing for Surgery  Before surgery, you can play an important role.  Because skin is not sterile, your skin needs to be as free of germs as possible.  You can reduce the number of germs on you skin by washing with CHG (chlorahexidine gluconate) soap before surgery.  CHG is an antiseptic cleaner which kills germs and bonds with the skin to continue killing germs even after washing.  Please DO NOT use if you have an allergy to CHG or antibacterial soaps.  If your skin becomes reddened/irritated stop using the CHG and inform your nurse when you arrive at Short Stay.  Do not shave (including legs and underarms) for  at least 48 hours prior to the first CHG shower.  You may shave your face.  Please follow these instructions carefully:   1.  Shower with CHG Soap the night before surgery and the                                morning of Surgery.  2.  If you choose to wash your hair, wash your hair first as usual with your       normal shampoo.  3.  After you shampoo, rinse your hair and body thoroughly to remove the                      Shampoo.  4.  Use CHG as you would any other liquid soap.  You can apply chg directly       to the skin and wash gently with scrungie or a clean washcloth.  5.  Apply the CHG Soap to your body ONLY FROM THE NECK DOWN.        Do not use on open wounds or open sores.  Avoid contact with your eyes,       ears, mouth and genitals (private parts).  Wash genitals (private parts)       with your normal soap.  6.  Wash thoroughly, paying special attention to the area where your surgery        will be performed.  7.  Thoroughly rinse your body with warm water from the neck down.  8.  DO NOT shower/wash with your normal soap after using and rinsing off       the CHG Soap.  9.  Pat yourself dry with a clean towel.            10.  Wear clean pajamas.            11.  Place clean sheets on your bed the night of your first shower and do not        sleep with pets.  Day of Surgery  Do not apply any lotions/deoderants the morning of surgery.  Please wear clean clothes to the hospital/surgery center.   Please read over the following fact sheets that you were given. Pain Booklet, Coughing and Deep Breathing, Blood Transfusion Information, MRSA Information and Surgical Site Infection Prevention

## 2015-11-12 ENCOUNTER — Encounter (HOSPITAL_COMMUNITY): Payer: Self-pay

## 2015-11-12 ENCOUNTER — Encounter (HOSPITAL_COMMUNITY)
Admission: RE | Admit: 2015-11-12 | Discharge: 2015-11-12 | Disposition: A | Discharge status: Home / Self Care | Payer: 59 | Source: Ambulatory Visit | Attending: Orthopedic Surgery | Admitting: Orthopedic Surgery

## 2015-11-12 DIAGNOSIS — M1711 Unilateral primary osteoarthritis, right knee: Secondary | ICD-10-CM | POA: Insufficient documentation

## 2015-11-12 DIAGNOSIS — Z01812 Encounter for preprocedural laboratory examination: Secondary | ICD-10-CM | POA: Diagnosis present

## 2015-11-12 DIAGNOSIS — Z0183 Encounter for blood typing: Secondary | ICD-10-CM | POA: Insufficient documentation

## 2015-11-12 HISTORY — DX: Personal history of other diseases of the digestive system: Z87.19

## 2015-11-12 HISTORY — DX: Calculus of kidney: N20.0

## 2015-11-12 LAB — COMPREHENSIVE METABOLIC PANEL
ALBUMIN: 4.2 g/dL (ref 3.5–5.0)
ALT: 58 U/L — ABNORMAL HIGH (ref 14–54)
AST: 58 U/L — AB (ref 15–41)
Alkaline Phosphatase: 67 U/L (ref 38–126)
Anion gap: 11 (ref 5–15)
BILIRUBIN TOTAL: 0.7 mg/dL (ref 0.3–1.2)
BUN: 10 mg/dL (ref 6–20)
CO2: 23 mmol/L (ref 22–32)
Calcium: 9.7 mg/dL (ref 8.9–10.3)
Chloride: 107 mmol/L (ref 101–111)
Creatinine, Ser: 0.83 mg/dL (ref 0.44–1.00)
GFR calc Af Amer: >60 mL/min (ref >60)
GFR calc non Af Amer: >60 mL/min (ref >60)
GLUCOSE: 97 mg/dL (ref 65–99)
POTASSIUM: 4.2 mmol/L (ref 3.5–5.1)
SODIUM: 141 mmol/L (ref 135–145)
TOTAL PROTEIN: 7.3 g/dL (ref 6.5–8.1)

## 2015-11-12 LAB — CBC WITH DIFFERENTIAL/PLATELET
BASOS ABS: 0.1 10*3/uL (ref 0.0–0.1)
BASOS PCT: 1 %
EOS ABS: 0.1 10*3/uL (ref 0.0–0.7)
Eosinophils Relative: 3 %
HEMATOCRIT: 43 % (ref 36.0–46.0)
HEMOGLOBIN: 13.6 g/dL (ref 12.0–15.0)
Lymphocytes Relative: 32 %
Lymphs Abs: 1.4 10*3/uL (ref 0.7–4.0)
MCH: 28 pg (ref 26.0–34.0)
MCHC: 31.6 g/dL (ref 30.0–36.0)
MCV: 88.7 fL (ref 78.0–100.0)
MONO ABS: 0.3 10*3/uL (ref 0.1–1.0)
Monocytes Relative: 7 %
NEUTROS ABS: 2.6 10*3/uL (ref 1.7–7.7)
NEUTROS PCT: 57 %
Platelets: 241 10*3/uL (ref 150–400)
RBC: 4.85 MIL/uL (ref 3.87–5.11)
RDW: 13.4 % (ref 11.5–15.5)
WBC: 4.5 10*3/uL (ref 4.0–10.5)

## 2015-11-12 LAB — PROTIME-INR
INR: 1.08 (ref 0.00–1.49)
Prothrombin Time: 14.2 seconds (ref 11.6–15.2)

## 2015-11-12 LAB — SURGICAL PCR SCREEN
MRSA, PCR: NEGATIVE
Staphylococcus aureus: NEGATIVE

## 2015-11-12 LAB — APTT: aPTT: 30 seconds (ref 24–37)

## 2015-11-12 LAB — TYPE AND SCREEN
ABO/RH(D): A POS
Antibody Screen: NEGATIVE

## 2015-11-12 LAB — ABO/RH: ABO/RH(D): A POS

## 2015-11-12 NOTE — Progress Notes (Signed)
PCP is Dr. Monico Blitz Denies ever seeing a cardiologist. Denies ever having a stress test, card cath, or echo.

## 2015-11-13 LAB — URINE CULTURE

## 2015-11-19 MED ORDER — LACTATED RINGERS IV SOLN
INTRAVENOUS | Status: DC
  Administered 2015-11-22 (×2): via INTRAVENOUS

## 2015-11-19 MED ORDER — CHLORHEXIDINE GLUCONATE 4 % EX LIQD: 60.0000 mL | Freq: Once | CUTANEOUS | Status: DC

## 2015-11-19 MED ORDER — CEFAZOLIN SODIUM-DEXTROSE 2-3 GM-% IV SOLR
2.0000 g | INTRAVENOUS | Status: AC
  Administered 2015-11-22: 2 g via INTRAVENOUS
  Filled 2015-11-19: qty 50

## 2015-11-19 MED ORDER — POVIDONE-IODINE 7.5 % EX SOLN
Freq: Once | CUTANEOUS | Status: DC
  Filled 2015-11-19: qty 118

## 2015-11-21 NOTE — Anesthesia Preprocedure Evaluation (Addendum)
Anesthesia Evaluation  Patient identified by MRN, date of birth, ID band Patient awake    Reviewed: Allergy & Precautions, NPO status , Patient's Chart, lab work & pertinent test results  History of Anesthesia Complications Negative for: history of anesthetic complications  Airway Mallampati: II  TM Distance: >3 FB Neck ROM: Full    Dental  (+) Teeth Intact   Pulmonary neg pulmonary ROS,    breath sounds clear to auscultation       Cardiovascular negative cardio ROS   Rhythm:Regular Rate:Normal     Neuro/Psych negative neurological ROS     GI/Hepatic hiatal hernia, GERD  ,  Endo/Other  Morbid obesity  Renal/GU      Musculoskeletal  (+) Arthritis ,   Abdominal   Peds  Hematology   Anesthesia Other Findings   Reproductive/Obstetrics                            Anesthesia Physical Anesthesia Plan  ASA: II  Anesthesia Plan: Regional and General   Post-op Pain Management: GA combined w/ Regional for post-op pain   Induction: Intravenous  Airway Management Planned: Oral ETT  Additional Equipment:   Intra-op Plan:   Post-operative Plan:   Informed Consent: I have reviewed the patients History and Physical, chart, labs and discussed the procedure including the risks, benefits and alternatives for the proposed anesthesia with the patient or authorized representative who has indicated his/her understanding and acceptance.   Dental advisory given  Plan Discussed with: CRNA and Surgeon  Anesthesia Plan Comments:        Anesthesia Quick Evaluation

## 2015-11-22 ENCOUNTER — Encounter (HOSPITAL_COMMUNITY)
Admission: RE | Disposition: A | Discharge status: Home / Self Care | Payer: Self-pay | Source: Ambulatory Visit | Attending: Orthopedic Surgery

## 2015-11-22 ENCOUNTER — Inpatient Hospital Stay (HOSPITAL_COMMUNITY): Payer: Commercial Managed Care - HMO | Admitting: Certified Registered Nurse Anesthetist

## 2015-11-22 ENCOUNTER — Inpatient Hospital Stay (HOSPITAL_COMMUNITY)
Admission: RE | Admit: 2015-11-22 | Discharge: 2015-11-24 | DRG: 470 | Disposition: A | Discharge status: Home / Self Care | Payer: Commercial Managed Care - HMO | Source: Ambulatory Visit | Attending: Orthopedic Surgery | Admitting: Orthopedic Surgery

## 2015-11-22 ENCOUNTER — Encounter (HOSPITAL_COMMUNITY): Payer: Self-pay | Admitting: Surgery

## 2015-11-22 DIAGNOSIS — M179 Osteoarthritis of knee, unspecified: Secondary | ICD-10-CM | POA: Diagnosis present

## 2015-11-22 DIAGNOSIS — M899 Disorder of bone, unspecified: Secondary | ICD-10-CM | POA: Diagnosis present

## 2015-11-22 DIAGNOSIS — F329 Major depressive disorder, single episode, unspecified: Secondary | ICD-10-CM | POA: Diagnosis present

## 2015-11-22 DIAGNOSIS — M1711 Unilateral primary osteoarthritis, right knee: Secondary | ICD-10-CM | POA: Diagnosis present

## 2015-11-22 DIAGNOSIS — K449 Diaphragmatic hernia without obstruction or gangrene: Secondary | ICD-10-CM | POA: Diagnosis present

## 2015-11-22 DIAGNOSIS — M949 Disorder of cartilage, unspecified: Secondary | ICD-10-CM

## 2015-11-22 DIAGNOSIS — M25561 Pain in right knee: Secondary | ICD-10-CM | POA: Diagnosis present

## 2015-11-22 DIAGNOSIS — Z882 Allergy status to sulfonamides status: Secondary | ICD-10-CM

## 2015-11-22 DIAGNOSIS — R945 Abnormal results of liver function studies: Secondary | ICD-10-CM | POA: Diagnosis present

## 2015-11-22 DIAGNOSIS — K219 Gastro-esophageal reflux disease without esophagitis: Secondary | ICD-10-CM | POA: Diagnosis present

## 2015-11-22 DIAGNOSIS — F32A Depression, unspecified: Secondary | ICD-10-CM | POA: Diagnosis present

## 2015-11-22 DIAGNOSIS — M1710 Unilateral primary osteoarthritis, unspecified knee: Secondary | ICD-10-CM | POA: Diagnosis present

## 2015-11-22 HISTORY — DX: Unilateral primary osteoarthritis, right knee: M17.11

## 2015-11-22 HISTORY — PX: TOTAL KNEE ARTHROPLASTY: SHX125

## 2015-11-22 LAB — CBC
HCT: 41.9 % (ref 36.0–46.0)
HEMOGLOBIN: 12.7 g/dL (ref 12.0–15.0)
MCH: 27.7 pg (ref 26.0–34.0)
MCHC: 30.3 g/dL (ref 30.0–36.0)
MCV: 91.3 fL (ref 78.0–100.0)
PLATELETS: 263 10*3/uL (ref 150–400)
RBC: 4.59 MIL/uL (ref 3.87–5.11)
RDW: 13.7 % (ref 11.5–15.5)
WBC: 12.5 10*3/uL — AB (ref 4.0–10.5)

## 2015-11-22 LAB — CREATININE, SERUM: Creatinine, Ser: 0.95 mg/dL (ref 0.44–1.00)

## 2015-11-22 SURGERY — ARTHROPLASTY, KNEE, TOTAL
Anesthesia: Regional | Site: Knee | Laterality: Right

## 2015-11-22 MED ORDER — PROPOFOL 10 MG/ML IV BOLUS
INTRAVENOUS | Status: AC
  Filled 2015-11-22: qty 20

## 2015-11-22 MED ORDER — ROCURONIUM BROMIDE 50 MG/5ML IV SOLN
INTRAVENOUS | Status: AC
  Filled 2015-11-22: qty 1

## 2015-11-22 MED ORDER — ACETAMINOPHEN 325 MG PO TABS
650.0000 mg | ORAL_TABLET | Freq: Four times a day (QID) | ORAL | Status: DC | PRN
  Administered 2015-11-23: 650 mg via ORAL
  Filled 2015-11-22: qty 2

## 2015-11-22 MED ORDER — CEFAZOLIN SODIUM-DEXTROSE 2-3 GM-% IV SOLR
2.0000 g | Freq: Four times a day (QID) | INTRAVENOUS | Status: AC
  Administered 2015-11-22 (×2): 2 g via INTRAVENOUS
  Filled 2015-11-22 (×2): qty 50

## 2015-11-22 MED ORDER — OXYCODONE HCL 5 MG PO TABS
ORAL_TABLET | ORAL | Status: AC
  Filled 2015-11-22: qty 2

## 2015-11-22 MED ORDER — PROPOFOL 10 MG/ML IV BOLUS
INTRAVENOUS | Status: DC | PRN
  Administered 2015-11-22: 160 mg via INTRAVENOUS

## 2015-11-22 MED ORDER — ENOXAPARIN SODIUM 30 MG/0.3ML ~~LOC~~ SOLN
30.0000 mg | SUBCUTANEOUS | Status: DC
  Administered 2015-11-23 – 2015-11-24 (×2): 30 mg via SUBCUTANEOUS
  Filled 2015-11-22 (×2): qty 0.3

## 2015-11-22 MED ORDER — SUCCINYLCHOLINE CHLORIDE 20 MG/ML IJ SOLN
INTRAMUSCULAR | Status: DC | PRN
  Administered 2015-11-22: 100 mg via INTRAVENOUS

## 2015-11-22 MED ORDER — DOCUSATE SODIUM 100 MG PO CAPS
100.0000 mg | ORAL_CAPSULE | Freq: Two times a day (BID) | ORAL | Status: DC
  Administered 2015-11-22 – 2015-11-24 (×5): 100 mg via ORAL
  Filled 2015-11-22 (×5): qty 1

## 2015-11-22 MED ORDER — DEXAMETHASONE SODIUM PHOSPHATE 10 MG/ML IJ SOLN
INTRAMUSCULAR | Status: AC
  Filled 2015-11-22: qty 1

## 2015-11-22 MED ORDER — LIDOCAINE HCL (CARDIAC) 20 MG/ML IV SOLN
INTRAVENOUS | Status: AC
  Filled 2015-11-22: qty 5

## 2015-11-22 MED ORDER — PANTOPRAZOLE SODIUM 40 MG PO TBEC
40.0000 mg | DELAYED_RELEASE_TABLET | Freq: Every day | ORAL | Status: DC
  Administered 2015-11-23: 40 mg via ORAL
  Filled 2015-11-22 (×2): qty 1

## 2015-11-22 MED ORDER — NYSTATIN 100000 UNIT/GM EX POWD
Freq: Three times a day (TID) | CUTANEOUS | Status: DC
Start: 2015-11-22 — End: 2015-11-24
  Administered 2015-11-22 – 2015-11-24 (×5): via TOPICAL
  Filled 2015-11-22: qty 15

## 2015-11-22 MED ORDER — SUCCINYLCHOLINE CHLORIDE 20 MG/ML IJ SOLN
INTRAMUSCULAR | Status: AC
  Filled 2015-11-22: qty 1

## 2015-11-22 MED ORDER — HYDROMORPHONE HCL 1 MG/ML IJ SOLN
INTRAMUSCULAR | Status: AC
  Administered 2015-11-22: 0.5 mg via INTRAVENOUS
  Filled 2015-11-22: qty 1

## 2015-11-22 MED ORDER — BUPIVACAINE HCL (PF) 0.5 % IJ SOLN
INTRAMUSCULAR | Status: DC | PRN
  Administered 2015-11-22: 30 mL

## 2015-11-22 MED ORDER — DEXAMETHASONE SODIUM PHOSPHATE 10 MG/ML IJ SOLN
INTRAMUSCULAR | Status: DC | PRN
  Administered 2015-11-22: 10 mg via INTRAVENOUS

## 2015-11-22 MED ORDER — MIDAZOLAM HCL 5 MG/5ML IJ SOLN
INTRAMUSCULAR | Status: DC | PRN
  Administered 2015-11-22: 1 mg via INTRAVENOUS
  Administered 2015-11-22 (×2): .5 mg via INTRAVENOUS

## 2015-11-22 MED ORDER — HYDROMORPHONE HCL 1 MG/ML IJ SOLN
0.2500 mg | INTRAMUSCULAR | Status: DC | PRN
  Administered 2015-11-22 (×3): 0.5 mg via INTRAVENOUS

## 2015-11-22 MED ORDER — ALUM & MAG HYDROXIDE-SIMETH 200-200-20 MG/5ML PO SUSP: 30.0000 mL | ORAL | Status: DC | PRN

## 2015-11-22 MED ORDER — MENTHOL 3 MG MT LOZG: 1.0000 | LOZENGE | OROMUCOSAL | Status: DC | PRN

## 2015-11-22 MED ORDER — ONDANSETRON HCL 4 MG/2ML IJ SOLN
INTRAMUSCULAR | Status: AC
  Filled 2015-11-22: qty 2

## 2015-11-22 MED ORDER — FENTANYL CITRATE (PF) 100 MCG/2ML IJ SOLN
INTRAMUSCULAR | Status: DC | PRN
  Administered 2015-11-22 (×3): 50 ug via INTRAVENOUS
  Administered 2015-11-22: 25 ug via INTRAVENOUS
  Administered 2015-11-22 (×3): 50 ug via INTRAVENOUS
  Administered 2015-11-22: 25 ug via INTRAVENOUS

## 2015-11-22 MED ORDER — BUPIVACAINE-EPINEPHRINE (PF) 0.25% -1:200000 IJ SOLN
INTRAMUSCULAR | Status: AC
  Filled 2015-11-22: qty 30

## 2015-11-22 MED ORDER — DEXAMETHASONE SODIUM PHOSPHATE 10 MG/ML IJ SOLN
10.0000 mg | Freq: Three times a day (TID) | INTRAMUSCULAR | Status: AC
  Administered 2015-11-22 – 2015-11-23 (×3): 10 mg via INTRAVENOUS
  Filled 2015-11-22 (×3): qty 1

## 2015-11-22 MED ORDER — ESCITALOPRAM OXALATE 10 MG PO TABS
10.0000 mg | ORAL_TABLET | Freq: Every day | ORAL | Status: DC
  Administered 2015-11-23 – 2015-11-24 (×2): 10 mg via ORAL
  Filled 2015-11-22 (×2): qty 1

## 2015-11-22 MED ORDER — GLYCOPYRROLATE 0.2 MG/ML IJ SOLN
INTRAMUSCULAR | Status: DC | PRN
  Administered 2015-11-22 (×2): 0.2 mg via INTRAVENOUS

## 2015-11-22 MED ORDER — EPHEDRINE SULFATE 50 MG/ML IJ SOLN
INTRAMUSCULAR | Status: AC
  Filled 2015-11-22: qty 1

## 2015-11-22 MED ORDER — PHENYLEPHRINE 40 MCG/ML (10ML) SYRINGE FOR IV PUSH (FOR BLOOD PRESSURE SUPPORT)
PREFILLED_SYRINGE | INTRAVENOUS | Status: AC
  Filled 2015-11-22: qty 10

## 2015-11-22 MED ORDER — NYSTATIN 100000 UNIT/GM EX OINT
TOPICAL_OINTMENT | CUTANEOUS | Status: DC
  Filled 2015-11-22: qty 15

## 2015-11-22 MED ORDER — POTASSIUM CHLORIDE IN NACL 20-0.9 MEQ/L-% IV SOLN
INTRAVENOUS | Status: DC
  Administered 2015-11-22 – 2015-11-23 (×2): via INTRAVENOUS
  Filled 2015-11-22 (×2): qty 1000

## 2015-11-22 MED ORDER — METOCLOPRAMIDE HCL 5 MG/ML IJ SOLN: 5.0000 mg | Freq: Three times a day (TID) | INTRAMUSCULAR | Status: DC | PRN

## 2015-11-22 MED ORDER — PHENOL 1.4 % MT LIQD: 1.0000 | OROMUCOSAL | Status: DC | PRN

## 2015-11-22 MED ORDER — PROMETHAZINE HCL 25 MG/ML IJ SOLN: 6.2500 mg | INTRAMUSCULAR | Status: DC | PRN

## 2015-11-22 MED ORDER — FENTANYL CITRATE (PF) 250 MCG/5ML IJ SOLN
INTRAMUSCULAR | Status: AC
  Filled 2015-11-22: qty 5

## 2015-11-22 MED ORDER — DIPHENHYDRAMINE HCL 12.5 MG/5ML PO ELIX: 12.5000 mg | ORAL_SOLUTION | ORAL | Status: DC | PRN

## 2015-11-22 MED ORDER — MIDAZOLAM HCL 2 MG/2ML IJ SOLN
INTRAMUSCULAR | Status: AC
  Filled 2015-11-22: qty 2

## 2015-11-22 MED ORDER — ONDANSETRON HCL 4 MG/2ML IJ SOLN
INTRAMUSCULAR | Status: DC | PRN
  Administered 2015-11-22: 4 mg via INTRAVENOUS

## 2015-11-22 MED ORDER — OXYCODONE HCL 5 MG PO TABS
5.0000 mg | ORAL_TABLET | ORAL | Status: DC | PRN
Start: 2015-11-22 — End: 2015-11-24
  Administered 2015-11-22 – 2015-11-24 (×13): 10 mg via ORAL
  Filled 2015-11-22 (×12): qty 2

## 2015-11-22 MED ORDER — SODIUM CHLORIDE 0.9 % IR SOLN
Status: DC | PRN
  Administered 2015-11-22 (×3): 1000 mL

## 2015-11-22 MED ORDER — POLYETHYLENE GLYCOL 3350 17 G PO PACK
17.0000 g | PACK | Freq: Two times a day (BID) | ORAL | Status: DC
  Administered 2015-11-22 – 2015-11-24 (×5): 17 g via ORAL
  Filled 2015-11-22 (×5): qty 1

## 2015-11-22 MED ORDER — METOCLOPRAMIDE HCL 5 MG PO TABS: 5.0000 mg | ORAL_TABLET | Freq: Three times a day (TID) | ORAL | Status: DC | PRN

## 2015-11-22 MED ORDER — ACETAMINOPHEN 650 MG RE SUPP: 650.0000 mg | Freq: Four times a day (QID) | RECTAL | Status: DC | PRN

## 2015-11-22 MED ORDER — LIDOCAINE HCL (CARDIAC) 20 MG/ML IV SOLN
INTRAVENOUS | Status: DC | PRN
  Administered 2015-11-22: 80 mg via INTRAVENOUS

## 2015-11-22 MED ORDER — ONDANSETRON HCL 4 MG/2ML IJ SOLN: 4.0000 mg | Freq: Four times a day (QID) | INTRAMUSCULAR | Status: DC | PRN

## 2015-11-22 MED ORDER — ONDANSETRON HCL 4 MG PO TABS: 4.0000 mg | ORAL_TABLET | Freq: Four times a day (QID) | ORAL | Status: DC | PRN

## 2015-11-22 MED ORDER — BUPIVACAINE-EPINEPHRINE 0.25% -1:200000 IJ SOLN
INTRAMUSCULAR | Status: DC | PRN
  Administered 2015-11-22: 30 mL

## 2015-11-22 MED ORDER — 0.9 % SODIUM CHLORIDE (POUR BTL) OPTIME
TOPICAL | Status: DC | PRN
  Administered 2015-11-22: 1000 mL

## 2015-11-22 MED ORDER — HYDROMORPHONE HCL 1 MG/ML IJ SOLN
1.0000 mg | INTRAMUSCULAR | Status: DC | PRN
  Administered 2015-11-22 – 2015-11-24 (×7): 1 mg via INTRAVENOUS
  Filled 2015-11-22 (×7): qty 1

## 2015-11-22 SURGICAL SUPPLY — 76 items
APL SKNCLS STERI-STRIP NONHPOA (GAUZE/BANDAGES/DRESSINGS) ×1
BANDAGE ESMARK 6X9 LF (GAUZE/BANDAGES/DRESSINGS) ×1 IMPLANT
BENZOIN TINCTURE PRP APPL 2/3 (GAUZE/BANDAGES/DRESSINGS) ×3 IMPLANT
BLADE SAGITTAL 25.0X1.19X90 (BLADE) ×2 IMPLANT
BLADE SAGITTAL 25.0X1.19X90MM (BLADE) ×1
BLADE SAW RECIP 87.9 MT (BLADE) ×2 IMPLANT
BLADE SAW SAG 90X13X1.27 (BLADE) ×3 IMPLANT
BLADE SURG 10 STRL SS (BLADE) ×6 IMPLANT
BNDG CMPR 9X6 STRL LF SNTH (GAUZE/BANDAGES/DRESSINGS) ×1
BNDG CMPR MED 15X6 ELC VLCR LF (GAUZE/BANDAGES/DRESSINGS) ×1
BNDG ELASTIC 6X15 VLCR STRL LF (GAUZE/BANDAGES/DRESSINGS) ×3 IMPLANT
BNDG ESMARK 6X9 LF (GAUZE/BANDAGES/DRESSINGS) ×3
BOWL SMART MIX CTS (DISPOSABLE) ×3 IMPLANT
CAPT KNEE TOTAL 3 ATTUNE ×2 IMPLANT
CEMENT HV SMART SET (Cement) ×6 IMPLANT
CLOSURE WOUND 1/2 X4 (GAUZE/BANDAGES/DRESSINGS) ×1
COVER SURGICAL LIGHT HANDLE (MISCELLANEOUS) ×3 IMPLANT
CUFF TOURNIQUET SINGLE 34IN LL (TOURNIQUET CUFF) ×3 IMPLANT
DECANTER SPIKE VIAL GLASS SM (MISCELLANEOUS) ×3 IMPLANT
DRAPE EXTREMITY T 121X128X90 (DRAPE) ×3 IMPLANT
DRAPE INCISE IOBAN 66X45 STRL (DRAPES) ×3 IMPLANT
DRAPE PROXIMA HALF (DRAPES) ×3 IMPLANT
DRAPE U-SHAPE 47X51 STRL (DRAPES) ×3 IMPLANT
DRSG AQUACEL AG ADV 3.5X10 (GAUZE/BANDAGES/DRESSINGS) ×3 IMPLANT
DRSG AQUACEL AG ADV 3.5X14 (GAUZE/BANDAGES/DRESSINGS) ×3 IMPLANT
DURAPREP 26ML APPLICATOR (WOUND CARE) ×6 IMPLANT
ELECT CAUTERY BLADE 6.4 (BLADE) ×3 IMPLANT
ELECT REM PT RETURN 9FT ADLT (ELECTROSURGICAL) ×3
ELECTRODE REM PT RTRN 9FT ADLT (ELECTROSURGICAL) ×1 IMPLANT
FACESHIELD WRAPAROUND (MASK) ×3 IMPLANT
FACESHIELD WRAPAROUND OR TEAM (MASK) ×1 IMPLANT
GAUZE SPONGE 4X4 12PLY STRL (GAUZE/BANDAGES/DRESSINGS) ×3 IMPLANT
GLOVE BIO SURGEON STRL SZ7 (GLOVE) ×5 IMPLANT
GLOVE BIOGEL PI IND STRL 6.5 (GLOVE) IMPLANT
GLOVE BIOGEL PI IND STRL 7.0 (GLOVE) ×1 IMPLANT
GLOVE BIOGEL PI IND STRL 7.5 (GLOVE) ×1 IMPLANT
GLOVE BIOGEL PI INDICATOR 6.5 (GLOVE) ×6
GLOVE BIOGEL PI INDICATOR 7.0 (GLOVE) ×2
GLOVE BIOGEL PI INDICATOR 7.5 (GLOVE) ×4
GLOVE SS BIOGEL STRL SZ 7.5 (GLOVE) ×1 IMPLANT
GLOVE SUPERSENSE BIOGEL SZ 7.5 (GLOVE) ×2
GLOVE SURG SS PI 7.5 STRL IVOR (GLOVE) ×2 IMPLANT
GOWN STRL REUS W/ TWL LRG LVL3 (GOWN DISPOSABLE) ×1 IMPLANT
GOWN STRL REUS W/ TWL XL LVL3 (GOWN DISPOSABLE) ×2 IMPLANT
GOWN STRL REUS W/TWL LRG LVL3 (GOWN DISPOSABLE) ×3
GOWN STRL REUS W/TWL XL LVL3 (GOWN DISPOSABLE) ×6
HANDPIECE INTERPULSE COAX TIP (DISPOSABLE) ×3
HOOD PEEL AWAY FACE SHEILD DIS (HOOD) ×8 IMPLANT
IMMOBILIZER KNEE 22 UNIV (SOFTGOODS) ×3 IMPLANT
KIT BASIN OR (CUSTOM PROCEDURE TRAY) ×3 IMPLANT
KIT ROOM TURNOVER OR (KITS) ×3 IMPLANT
MANIFOLD NEPTUNE II (INSTRUMENTS) ×3 IMPLANT
MARKER SKIN DUAL TIP RULER LAB (MISCELLANEOUS) ×3 IMPLANT
NDL 18GX1X1/2 (RX/OR ONLY) (NEEDLE) ×1 IMPLANT
NEEDLE 18GX1X1/2 (RX/OR ONLY) (NEEDLE) ×3 IMPLANT
NS IRRIG 1000ML POUR BTL (IV SOLUTION) ×3 IMPLANT
PACK TOTAL JOINT (CUSTOM PROCEDURE TRAY) ×3 IMPLANT
PACK UNIVERSAL I (CUSTOM PROCEDURE TRAY) ×3 IMPLANT
PAD ARMBOARD 7.5X6 YLW CONV (MISCELLANEOUS) ×6 IMPLANT
SET HNDPC FAN SPRY TIP SCT (DISPOSABLE) ×1 IMPLANT
STRIP CLOSURE SKIN 1/2X4 (GAUZE/BANDAGES/DRESSINGS) ×2 IMPLANT
SUCTION FRAZIER TIP 10 FR DISP (SUCTIONS) ×3 IMPLANT
SUT MNCRL AB 3-0 PS2 18 (SUTURE) ×3 IMPLANT
SUT VIC AB 0 CT1 27 (SUTURE) ×6
SUT VIC AB 0 CT1 27XBRD ANBCTR (SUTURE) ×2 IMPLANT
SUT VIC AB 1 CT1 27 (SUTURE) ×3
SUT VIC AB 1 CT1 27XBRD ANBCTR (SUTURE) ×1 IMPLANT
SUT VIC AB 2-0 CT1 27 (SUTURE) ×6
SUT VIC AB 2-0 CT1 TAPERPNT 27 (SUTURE) ×2 IMPLANT
SYR 30ML SLIP (SYRINGE) ×3 IMPLANT
TOWEL OR 17X24 6PK STRL BLUE (TOWEL DISPOSABLE) ×3 IMPLANT
TOWEL OR 17X26 10 PK STRL BLUE (TOWEL DISPOSABLE) ×3 IMPLANT
TRAY FOLEY CATH 16FR SILVER (SET/KITS/TRAYS/PACK) ×3 IMPLANT
TUBE CONNECTING 12'X1/4 (SUCTIONS) ×1
TUBE CONNECTING 12X1/4 (SUCTIONS) ×2 IMPLANT
YANKAUER SUCT BULB TIP NO VENT (SUCTIONS) ×3 IMPLANT

## 2015-11-22 NOTE — Progress Notes (Signed)
Utilization review completed. Mishael Krysiak, RN, BSN. 

## 2015-11-22 NOTE — Progress Notes (Signed)
Orthopedic Tech Progress Note Patient Details:  Kelly Montes Jul 19, 1951 [id number]  CPM Right Knee CPM Right Knee: On Right Knee Flexion (Degrees): 90 Right Knee Extension (Degrees): 0 Additional Comments: trapeze bar patient helper Viewed order from doctor's order list  Hildred Priest 11/22/2015, 10:02 AM

## 2015-11-22 NOTE — Transfer of Care (Signed)
Immediate Anesthesia Transfer of Care Note  Patient: Kelly Montes  Procedure(s) Performed: Procedure(s): TOTAL KNEE ARTHROPLASTY (Right)  Patient Location: PACU  Anesthesia Type:General  Level of Consciousness: awake  Airway & Oxygen Therapy: Patient Spontanous Breathing and Patient connected to nasal cannula oxygen  Post-op Assessment: Report given to RN, Post -op Vital signs reviewed and stable and Patient moving all extremities X 4  Post vital signs: stable  Last Vitals:  Filed Vitals:   11/22/15 0618 11/22/15 0930  BP: 138/64   Pulse: 65   Temp: 36.7 C 36.6 C  Resp: 20     Complications: No apparent anesthesia complications

## 2015-11-22 NOTE — Progress Notes (Signed)
Orthopedic Tech Progress Note Patient Details:  Kelly Montes 01/24/1951 [id number]  Patient ID: [name], female   DOB: [birth year], 65 y.o.   MRN: [id number] Left knee 0- 90 degrees  Sheleen Conchas 11/22/2015, 10:14 AM

## 2015-11-22 NOTE — Interval H&P Note (Signed)
History and Physical Interval Note:  11/22/2015 6:43 AM  Kelly Montes  has presented today for surgery, with the diagnosis of PRIMARY LOCALIZED OA RIGHT KNEE  The various methods of treatment have been discussed with the patient and family. After consideration of risks, benefits and other options for treatment, the patient has consented to  Procedure(s): TOTAL KNEE ARTHROPLASTY (Right) as a surgical intervention .  The patient's history has been reviewed, patient examined, no change in status, stable for surgery.  I have reviewed the patient's chart and labs.  Questions were answered to the patient's satisfaction.     Elsie Saas A

## 2015-11-22 NOTE — Op Note (Signed)
MRN:     [id number] DOB/AGE:    [birth year] / 65 y.o.       OPERATIVE REPORT    DATE OF PROCEDURE:  11/22/2015       PREOPERATIVE DIAGNOSIS:   PRIMARY LOCALIZED OA RIGHT KNEE      Estimated body mass index is 33.59 kg/(m^2) as calculated from the following:   Height as of this encounter: 5\' 6"  (1.676 m).   Weight as of this encounter: 94.348 kg (208 lb).                                                        POSTOPERATIVE DIAGNOSIS:   SAME                                                                     PROCEDURE:  Procedure(s): TOTAL KNEE ARTHROPLASTY Using Depuy Attune RP implants #6 Narrow Femur, #5Tibia, 61mm  RP bearing, 32 Patella     SURGEON: Beverlie Kurihara A    ASSISTANT:  Kirstin Shepperson PA-C   (Present and scrubbed throughout the case, critical for assistance with exposure, retraction, instrumentation, and closure.)         ANESTHESIA: GET with Adductor Nerve Block     TOURNIQUET TIME: [id number]   COMPLICATIONS:  None     SPECIMENS: None   INDICATIONS FOR PROCEDURE: The patient has  DJD RIGHT KNEE, varus deformities, XR shows bone on bone arthritis. Patient has failed all conservative measures including anti-inflammatory medicines, narcotics, attempts at  exercise and weight loss, cortisone injections and viscosupplementation.  Risks and benefits of surgery have been discussed, questions answered.   DESCRIPTION OF PROCEDURE: The patient identified by armband, received  right femoral nerve block and IV antibiotics, in the holding area at [facility]. Patient taken to the operating room, appropriate anesthetic  monitors were attached General endotracheal anesthesia induced with  the patient in supine position, Foley catheter was inserted. Tourniquet  applied high to the operative thigh. Lateral post and foot positioner  applied to the table, the lower extremity was then prepped and draped  in usual sterile fashion from the ankle to the tourniquet. Time-out procedure  was performed. The limb was wrapped with an Esmarch bandage and the tourniquet inflated to 365 mmHg. We began the operation by making the anterior midline incision starting at handbreadth above the patella going over the patella 1 cm medial to and  4 cm distal to the tibial tubercle. Small bleeders in the skin and the  subcutaneous tissue identified and cauterized. Transverse retinaculum was incised and reflected medially and a medial parapatellar arthrotomy was accomplished. the patella was everted and theprepatellar fat pad resected. The superficial medial collateral  ligament was then elevated from anterior to posterior along the proximal  flare of the tibia and anterior half of the menisci resected. The knee was hyperflexed exposing bone on bone arthritis. Peripheral and notch osteophytes as well as the cruciate ligaments were then resected. We continued to  work our way around posteriorly along the proximal tibia, and externally  rotated the tibia subluxing it  out from underneath the femur. A McHale  retractor was placed through the notch and a lateral Hohmann retractor  placed, and we then drilled through the proximal tibia in line with the  axis of the tibia followed by an intramedullary guide rod and 2-degree  posterior slope cutting guide. The tibial cutting guide was pinned into place  allowing resection of 4 mm of bone medially and about 6 mm of bone  laterally because of her varus deformity. Satisfied with the tibial resection, we then  entered the distal femur 2 mm anterior to the PCL origin with the  intramedullary guide rod and applied the distal femoral cutting guide  set at 34mm, with 5 degrees of valgus. This was pinned along the  epicondylar axis. At this point, the distal femoral cut was accomplished without difficulty. We then sized for a #6 narrow femoral component and pinned the guide in 3 degrees of external rotation.The chamfer cutting guide was pinned into place. The  anterior, posterior, and chamfer cuts were accomplished without difficulty followed by  the  RP box cutting guide and the box cut. We also removed posterior osteophytes from the posterior femoral condyles. At this  time, the knee was brought into full extension. We checked our  extension and flexion gaps and found them symmetric at 48mm.  The patella thickness measured at 23 mm. We set the cutting guide at 15 and removed the posterior 8 mm  of the patella sized for 32 button and drilled the lollipop. The knee  was then once again hyperflexed exposing the proximal tibia. We sized for a #5 tibial base plate, applied the smokestack and the conical reamer followed by the the Delta fin keel punch. We then hammered into place the  RP trial femoral component, inserted a 1 trial bearing, trial patellar button, and took the knee through range of motion from 0-130 degrees. No thumb pressure was required for patellar  tracking. At this point, all trial components were removed, a double batch of DePuy HV  was mixed and applied to all bony metallic mating surfaces except for the posterior condyles of the femur itself. In order, we  hammered into place the tibial tray and removed excess cement, the femoral component and removed excess cement, a 20mm  RP bearing  was inserted, and the knee brought to full extension with compression.  The patellar button was clamped into place, and excess cement  removed. While the cement cured the wound was irrigated out with normal saline solution pulse lavage.. Ligament stability and patellar tracking were checked and found to be excellent.. The parapatellar arthrotomy was closed with  #1 Vicryl suture. The subcutaneous tissue with 0 and 2-0 undyed  Vicryl suture, and 4-0 Monocryl.. A dressing of Aquaseal,  4 x 4, dressing sponges, Webril, and Ace wrap applied. Needle and sponge count were correct times 2.The patient awakened, extubated, and taken to recovery room without difficulty.  Vascular status was normal, pulses 2+ and symmetric.   Boniface Goffe A 11/22/2015, 8:51 AM

## 2015-11-22 NOTE — Evaluation (Signed)
Physical Therapy Evaluation Patient Details Name: Kelly Montes MRN: [id number] DOB: 08/06/1951 Today's Date: 11/22/2015   History of Present Illness  Pt admitted for R TKA. PMHx: GERD, OA, depression  Clinical Impression  Pt lethargic after surgery with decreased attention but able to follow all commands and assist with mobility. Pt with education for HEP, CPM use, bone foam (in use end of session), progression and plan but difficulty recalling end of session. Pt with decreased strength, transfers, gait and mobility who will benefit from acute therapy to maximize mobility, function and independence to decrease burden of care.     Follow Up Recommendations Home health PT    Equipment Recommendations  None recommended by PT    Recommendations for Other Services OT consult     Precautions / Restrictions Precautions Precautions: Fall;Knee Required Braces or Orthoses: Knee Immobilizer - Right Knee Immobilizer - Right: On when out of bed or walking;Discontinue once straight leg raise with < 10 degree lag Restrictions Weight Bearing Restrictions: Yes RLE Weight Bearing: Weight bearing as tolerated      Mobility  Bed Mobility Overal bed mobility: Needs Assistance Bed Mobility: Supine to Sit     Supine to sit: Min assist;HOB elevated     General bed mobility comments: assist to move bil LE to EOB with use of rail, cues for sequence, increased time   Transfers Overall transfer level: Needs assistance   Transfers: Sit to/from [facility];Lateral/Scoot Transfers Sit to Stand: Mod assist Stand pivot transfers: Min assist       General transfer comment: cues for hand placement, RLE position, safety and sequence, assist to rise from surface and for anterior translation. Min assist to advance RW and rotate pelvis to chair with pt able to take short pivotal steps to chair.   Ambulation/Gait                Stairs            Wheelchair Mobility    Modified Rankin (Stroke Patients Only)       Balance                                             Pertinent Vitals/Pain Pain Assessment: 0-10 Pain Score: 6  Pain Location: right knee Pain Descriptors / Indicators: Aching Pain Intervention(s): Monitored during session;Limited activity within patient's tolerance;Premedicated before session;Repositioned;Patient requesting pain meds-RN notified;Ice applied    Home Living Family/patient expects to be discharged to:: Private residence Living Arrangements: Spouse/significant other Available Help at Discharge: Available 24 hours/day Type of Home: House Home Access: Stairs to enter Entrance Stairs-Rails: None Entrance Stairs-Number of Steps: 2 Home Layout: One level Home Equipment: Environmental consultant - 2 wheels;Cane - single point;Bedside commode      Prior Function Level of Independence: Independent               Hand Dominance        Extremity/Trunk Assessment               Lower Extremity Assessment: Generalized weakness      Cervical / Trunk Assessment: Normal  Communication   Communication: No difficulties  Cognition Arousal/Alertness: Lethargic;Suspect due to medications Behavior During Therapy: Flat affect Overall Cognitive Status: Impaired/Different from baseline Area of Impairment: Attention   Current Attention Level: Sustained  General Comments      Exercises Total Joint Exercises Quad Sets: AROM;Right;10 reps;Supine (very weak with limited activation) Heel Slides: AAROM;Right;10 reps;Supine      Assessment/Plan    PT Assessment Patient needs continued PT services  PT Diagnosis Difficulty walking;Generalized weakness;Acute pain   PT Problem List Decreased strength;Decreased range of motion;Decreased activity tolerance;Decreased mobility;Pain;Decreased knowledge of use of DME;Decreased knowledge of precautions  PT Treatment Interventions DME instruction;Gait  training;Stair training;Functional mobility training;Therapeutic activities;Therapeutic exercise;Patient/family education   PT Goals (Current goals can be found in the Care Plan section) Acute Rehab PT Goals Patient Stated Goal: return home PT Goal Formulation: With patient Time For Goal Achievement: 11/29/15 Potential to Achieve Goals: Good    Frequency 7X/week   Barriers to discharge        Co-evaluation               End of Session Equipment Utilized During Treatment: Gait belt;Right knee immobilizer Activity Tolerance: Patient tolerated treatment well;Patient limited by lethargy Patient left: in chair;with call bell/phone within reach;with chair alarm set;with family/visitor present Nurse Communication: Mobility status;Precautions;Weight bearing status         Time: 1345-1410 PT Time Calculation (min) (ACUTE ONLY): 25 min   Charges:   PT Evaluation $PT Eval Moderate Complexity: 1 Procedure PT Treatments $Therapeutic Activity: 8-22 mins   PT G CodesMelford Aase 11/22/2015, 2:19 PM Elwyn Reach, [place]

## 2015-11-22 NOTE — Anesthesia Postprocedure Evaluation (Signed)
Anesthesia Post Note  Patient: Kelly Montes  Procedure(s) Performed: Procedure(s) (LRB): TOTAL KNEE ARTHROPLASTY (Right)  Patient location during evaluation: PACU Anesthesia Type: General and Regional Level of consciousness: awake and alert Pain management: pain level controlled Vital Signs Assessment: post-procedure vital signs reviewed and stable Respiratory status: spontaneous breathing, nonlabored ventilation, respiratory function stable and patient connected to nasal cannula oxygen Cardiovascular status: blood pressure returned to baseline and stable Postop Assessment: no signs of nausea or vomiting Anesthetic complications: no    Last Vitals:  Filed Vitals:   11/22/15 1145 11/22/15 1200  BP:  154/95  Pulse: 106 105  Temp:    Resp: 10 11    Last Pain:  Filed Vitals:   11/22/15 1203  PainSc: Asleep                 Aquiles Ruffini,JAMES TERRILL

## 2015-11-22 NOTE — Anesthesia Procedure Notes (Addendum)
Anesthesia Regional Block:  Adductor canal block  Pre-Anesthetic Checklist: ,, timeout performed, Correct Patient, Correct Site, Correct Laterality, Correct Procedure, Correct Position, site marked, Risks and benefits discussed,  Surgical consent,  Pre-op evaluation,  At surgeon's request and post-op pain management  Laterality: Right and Lower  Prep: chloraprep       Needles:   Needle Type: Echogenic Stimulator Needle     Needle Length: 9cm 9 cm Needle Gauge: 21 and 21 G  Needle insertion depth: 7 cm   Additional Needles:  Procedures: ultrasound guided (picture in chart) Adductor canal block Narrative:  Start time: 11/22/2015 6:50 AM End time: 11/22/2015 7:09 AM Injection made incrementally with aspirations every 5 mL.  Performed by: Personally  Anesthesiologist: MASSAG[place], TERRY  Additional Notes: Tolerated well   Procedure Name: Intubation Date/Time: 11/22/2015 7:24 AM Performed by: Rejeana Brock L Pre-anesthesia Checklist: Patient identified, Timeout performed, Emergency Drugs available, Suction available and Patient being monitored Patient Re-evaluated:Patient Re-evaluated prior to inductionOxygen Delivery Method: Circle system utilized Preoxygenation: Pre-oxygenation with 100% oxygen Intubation Type: IV induction Ventilation: Mask ventilation without difficulty Laryngoscope Size: Mac and 3 Grade View: Grade III Tube type: Oral Tube size: 7.0 mm Number of attempts: 1 Airway Equipment and Method: Stylet Placement Confirmation: ETT inserted through vocal cords under direct vision,  positive ETCO2 and breath sounds checked- equal and bilateral Secured at: 21 cm Tube secured with: Tape Dental Injury: Teeth and Oropharynx as per pre-operative assessment

## 2015-11-23 ENCOUNTER — Encounter (HOSPITAL_COMMUNITY): Payer: Self-pay | Admitting: Orthopedic Surgery

## 2015-11-23 LAB — BASIC METABOLIC PANEL
Anion gap: 10 (ref 5–15)
BUN: 7 mg/dL (ref 6–20)
CALCIUM: 8.3 mg/dL — AB (ref 8.9–10.3)
CHLORIDE: 104 mmol/L (ref 101–111)
CO2: 23 mmol/L (ref 22–32)
CREATININE: 0.74 mg/dL (ref 0.44–1.00)
Glucose, Bld: 157 mg/dL — ABNORMAL HIGH (ref 65–99)
Potassium: 4.1 mmol/L (ref 3.5–5.1)
SODIUM: 137 mmol/L (ref 135–145)

## 2015-11-23 LAB — CBC
HCT: 35.1 % — ABNORMAL LOW (ref 36.0–46.0)
HEMOGLOBIN: 11.2 g/dL — AB (ref 12.0–15.0)
MCH: 28.7 pg (ref 26.0–34.0)
MCHC: 31.9 g/dL (ref 30.0–36.0)
MCV: 90 fL (ref 78.0–100.0)
PLATELETS: 233 10*3/uL (ref 150–400)
RBC: 3.9 MIL/uL (ref 3.87–5.11)
RDW: 13.5 % (ref 11.5–15.5)
WBC: 10.8 10*3/uL — ABNORMAL HIGH (ref 4.0–10.5)

## 2015-11-23 NOTE — Progress Notes (Signed)
Physical Therapy Treatment Patient Details Name: Kelly Montes MRN: [id number] DOB: 22-Jun-1951 Today's Date: Dec 20, 2015    History of Present Illness Pt admitted for R TKA. PMHx: GERD, OA, depression    PT Comments    Pt progressing well and far more awake than on eval. Pt able to complete hall ambulation and perform SLR to D/C KI. Pt educated for HEP, CPM, plan, progression and stairs for PM session. Will continue to follow.   Follow Up Recommendations  Home health PT     Equipment Recommendations       Recommendations for Other Services       Precautions / Restrictions Precautions Precautions: Fall;Knee Restrictions RLE Weight Bearing: Weight bearing as tolerated    Mobility  Bed Mobility Overal bed mobility: Modified Independent             General bed mobility comments: with use of rail and HOB 20 degrees  Transfers Overall transfer level: Needs assistance   Transfers: Sit to/from Stand Sit to Stand: Supervision         General transfer comment: cues for hand placement and safety  Ambulation/Gait Ambulation/Gait assistance: Supervision Ambulation Distance (Feet): 150 Feet Assistive device: Rolling walker (2 wheeled) Gait Pattern/deviations: Step-through pattern;Decreased stride length;Decreased dorsiflexion - right   Gait velocity interpretation: Below normal speed for age/gender General Gait Details: cues for heel strike, posture, position in RW and step through gait pattern   Stairs            Wheelchair Mobility    Modified Rankin (Stroke Patients Only)       Balance                                    Cognition Arousal/Alertness: Awake/alert Behavior During Therapy: WFL for tasks assessed/performed Overall Cognitive Status: Within Functional Limits for tasks assessed                      Exercises Total Joint Exercises Heel Slides: AAROM;Right;15 reps;Supine Hip ABduction/ADduction: AROM;Right;15  reps;Supine Straight Leg Raises: AROM;Right;15 reps;Supine Long Arc Quad: AROM;Right;15 reps;Supine    General Comments        Pertinent Vitals/Pain Pain Score: 4  Pain Location: right knee Pain Descriptors / Indicators: Aching Pain Intervention(s): Limited activity within patient's tolerance;Monitored during session;Premedicated before session;Repositioned    Home Living                      Prior Function            PT Goals (current goals can now be found in the care plan section) Progress towards PT goals: Progressing toward goals    Frequency       PT Plan Current plan remains appropriate    Co-evaluation             End of Session Equipment Utilized During Treatment: Gait belt Activity Tolerance: Patient tolerated treatment well Patient left: in chair;with call bell/phone within reach (in bone foam)     Time: [id number] PT Time Calculation (min) (ACUTE ONLY): 33 min  Charges:  $Gait Training: 8-22 mins $Therapeutic Exercise: 8-22 mins                    G Codes:      Melford Aase 2015/12/20, 8:29 AM Elwyn Reach, [place]

## 2015-11-23 NOTE — Progress Notes (Signed)
Orthopedic Tech Progress Note Patient Details:  Kelly Montes 1951-02-15 [id number]  Patient ID: [name], female   DOB: 1950/12/22, 65 y.o.   MRN: [id number] Placed pt's rle on cpm @0 -85 degrees @ [place], [place] 11/23/2015, 2:33 PM

## 2015-11-23 NOTE — Evaluation (Addendum)
Occupational Therapy Evaluation Patient Details Name: Kelly Montes MRN: [id number] DOB: 07/18/1951 Today's Date: 11/23/2015    History of Present Illness Pt admitted for R TKA. PMHx: GERD, OA, depression, wrist surgery.   Clinical Impression   Pt s/p above. Pt was independent with dressing and bathing, PTA. Pt received assist with IADLs, PTA. Feel pt will benefit from acute OT to increase independence and safety prior to d/c. Pt's HR up to 121 in session.    Follow Up Recommendations  No OT follow up;Supervision - Intermittent    Equipment Recommendations  None recommended by OT    Recommendations for Other Services       Precautions / Restrictions Precautions Precautions: Fall;Knee Precaution Booklet Issued: No Precaution Comments: educated on knee precautions Restrictions Weight Bearing Restrictions: Yes RLE Weight Bearing: Weight bearing as tolerated      Mobility Bed Mobility       General bed mobility comments: not assessed  Transfers Overall transfer level: Needs assistance   Transfers: Sit to/from Stand Sit to Stand: Supervision (and setup for RW)         General transfer comment: cues for hand placement and safety    Balance    Unsteady with simulated shower transfer-Min guard assist given.                                        ADL Overall ADL's : Needs assistance/impaired                     Lower Body Dressing: Min guard;Sit to/from stand   Toilet Transfer: Min guard;Ambulation;RW (sit to stand from chair)       Tub/ Shower Transfer: Walk-in shower;Min guard;Ambulation;Rolling walker   Functional mobility during ADLs: Min guard;Rolling walker General ADL Comments: Educated on LB dressing technique. Educated on safe footwear, use of bag on walker, rugs/items on floor, and recommended someone be with her for shower transfer and bathing. Explained benefit of reaching down to right foot for LB dressing as it  allows knee to bend. Discussed reacher.  Educated on energy conservation.       Vision  History of cataract removal    Perception     Praxis      Pertinent Vitals/Pain Pain Assessment: 0-10 Pain Score: 6  Pain Location: right knee Pain Intervention(s): Monitored during session HR up to 121 in session but trended down with taking a break.     Hand Dominance     Extremity/Trunk Assessment Upper Extremity Assessment Upper Extremity Assessment: Overall WFL for tasks assessed   Lower Extremity Assessment Lower Extremity Assessment: Defer to PT evaluation       Communication Communication Communication: No difficulties   Cognition Arousal/Alertness: Awake/alert Behavior During Therapy: WFL for tasks assessed/performed Overall Cognitive Status: Within Functional Limits for tasks assessed                     General Comments       Exercises       Shoulder Instructions      Home Living Family/patient expects to be discharged to:: Private residence Living Arrangements: Spouse/significant other Available Help at Discharge: Available 24 hours/day;Family Type of Home: House Home Access: Stairs to enter [employer] of Steps: 2 Entrance Stairs-Rails: None Home Layout: One level     Bathroom Shower/Tub: Walk-in shower;Curtain  Home Equipment: [place] - 2 wheels;Cane - single point;Bedside commode;Shower seat;Adaptive equipment Adaptive Equipment: Reacher        Prior Functioning/Environment Level of Independence: Needs assistance  Gait / Transfers Assistance Needed: assist with transfers at times ADL's / [place] Needed: assist with cooking and cleaning        OT Diagnosis: Acute pain   OT Problem List: Decreased activity tolerance;Pain;Impaired balance (sitting and/or standing);Decreased knowledge of use of DME or AE;Decreased knowledge of precautions;Decreased strength   OT Treatment/Interventions: Self-care/ADL  training;DME and/or AE instruction;Therapeutic activities;Patient/family education;Balance training    OT Goals(Current goals can be found in the care plan section) Acute Rehab OT Goals Patient Stated Goal: get back to her old self OT Goal Formulation: With patient Time For Goal Achievement: 11/30/15 Potential to Achieve Goals: Good ADL Goals Pt Will Perform Lower Body Dressing: with set-up;sit to/from stand Pt Will Transfer to Toilet: ambulating;with set-up (3 in 1 over commode) Pt Will Perform Tub/Shower Transfer: Shower transfer;with supervision;with set-up;ambulating;rolling walker;shower seat Additional ADL Goal #1: Pt will independently use 1 energy conservation technique in session as needed.  OT Frequency: Min 2X/week   Barriers to D/C:            Co-evaluation              End of Session Equipment Utilized During Treatment: Gait belt;Rolling walker CPM Right Knee CPM Right Knee: Off  Activity Tolerance: Patient tolerated treatment well Patient left: in chair;with call bell/phone within reach   Time: 0912-0934 OT Time Calculation (min): 22 min Charges:  OT General Charges $OT Visit: 1 Procedure OT Evaluation $OT Eval Moderate Complexity: 1 Procedure G-CodesBenito Mccreedy OTR/L [id number] 11/23/2015, 9:53 AM

## 2015-11-23 NOTE — Progress Notes (Signed)
Subjective: 1 Day Post-Op Procedure(s) (LRB): TOTAL KNEE ARTHROPLASTY (Right) Patient reports pain as 4 on 0-10 scale.    Objective: Vital signs in last 24 hours: Temp:  [97.7 F (36.5 C)-98.9 F (37.2 C)] 98.6 F (37 C) (01/17 0522) Pulse Rate:  [67-114] 67 (01/17 0522) Resp:  [7-20] 15 (01/17 0522) BP: (118-154)/(64-95) 148/72 mmHg (01/17 0522) SpO2:  [90 %-98 %] 98 % (01/17 0522)  Intake/Output from previous day: 01/16 0701 - 01/17 0700 In: 3580 [P.O.:480; I.V.:3000; IV Piggyback:100] Out: 3075 [Urine:3050; Blood:25] Intake/Output this shift:     Recent Labs  11/22/15 1417 11/23/15 0630  HGB 12.7 11.2*    Recent Labs  11/22/15 1417 11/23/15 0630  WBC 12.5* 10.8*  RBC 4.59 3.90  HCT 41.9 35.1*  PLT 263 233    Recent Labs  11/22/15 1417 11/23/15 0630  NA  --  137  K  --  4.1  CL  --  104  CO2  --  23  BUN  --  7  CREATININE 0.95 0.74  GLUCOSE  --  157*  CALCIUM  --  8.3*   No results for input(s): LABPT, INR in the last 72 hours.  ABD soft Neurovascular intact Sensation intact distally Intact pulses distally Incision: dressing C/D/I  Assessment/Plan: 1 Day Post-Op Procedure(s) (LRB): TOTAL KNEE ARTHROPLASTY (Right)  Principal Problem:   Primary localized osteoarthritis of right knee Active Problems:   Depression   GERD   Disorder of bone and cartilage   Liver function abnormality   DJD (degenerative joint disease) of knee  Advance diet Up with therapy Plan for discharge tomorrow Discharge home with home health  Filley J 11/23/2015, 8:03 AM

## 2015-11-23 NOTE — Progress Notes (Signed)
Physical Therapy Treatment Patient Details Name: Kelly Montes MRN: [id number] DOB: Aug 31, 1951 Today's Date: 11/23/2015    History of Present Illness Pt admitted for R TKA. PMHx: GERD, OA, depression, wrist surgery.    PT Comments    Pt with continued progression with gait and HEP. Pt moving well with step-through gait and increased knee flexion. In bone foam end of session with pt and family educated for rotation of activities throughout the day and CPM use after lunch. Will continue to follow.   Follow Up Recommendations  Home health PT     Equipment Recommendations       Recommendations for Other Services       Precautions / Restrictions Precautions Precautions: Fall;Knee Precaution Booklet Issued: No Precaution Comments: educated on knee precautions Restrictions Weight Bearing Restrictions: Yes RLE Weight Bearing: Weight bearing as tolerated    Mobility  Bed Mobility Overal bed mobility: Modified Independent             General bed mobility comments: in chair on arrival  Transfers Overall transfer level: Needs assistance   Transfers: Sit to/from Stand Sit to Stand: Supervision         General transfer comment: cues for hand placement and safety  Ambulation/Gait Ambulation/Gait assistance: Supervision Ambulation Distance (Feet): 300 Feet Assistive device: Rolling walker (2 wheeled) Gait Pattern/deviations: Step-through pattern;Decreased stride length   Gait velocity interpretation: Below normal speed for age/gender General Gait Details: intial step to pattern with progression to step through with cues for sequence, good heel strike in step through   Stairs Stairs: Yes Stairs assistance: Min assist Stair Management: Backwards;With walker Number of Stairs: 4 General stair comments: pt and dgtr educated for stairs with dgtr present to assist. Performed 2 steps x 2 trials  Wheelchair Mobility    Modified Rankin (Stroke Patients Only)        Balance                                    Cognition Arousal/Alertness: Awake/alert Behavior During Therapy: WFL for tasks assessed/performed Overall Cognitive Status: Within Functional Limits for tasks assessed                      Exercises Total Joint Exercises Heel Slides: AAROM;Right;15 reps;Seated Hip ABduction/ADduction: AROM;Right;15 reps;Seated Straight Leg Raises: AROM;Right;15 reps;Seated Long Arc Quad: AROM;Right;15 reps;Seated Goniometric ROM: 8-85    General Comments        Pertinent Vitals/Pain Pain Assessment: 0-10 Pain Score: 5  Pain Location: right knee Pain Descriptors / Indicators: Aching Pain Intervention(s): Limited activity within patient's tolerance;Monitored during session;Repositioned;Patient requesting pain meds-RN notified    Home Living Family/patient expects to be discharged to:: Private residence Living Arrangements: Spouse/significant other Available Help at Discharge: Available 24 hours/day;Family Type of Home: House Home Access: Stairs to enter Entrance Stairs-Rails: None Home Layout: One level Home Equipment: Environmental consultant - 2 wheels;Cane - single point;Bedside commode;Shower seat;Adaptive equipment      Prior Function Level of Independence: Needs assistance  Gait / Transfers Assistance Needed: assist with transfers at times ADL's / Homemaking Assistance Needed: assist with cooking and cleaning     PT Goals (current goals can now be found in the care plan section) Acute Rehab PT Goals Patient Stated Goal: get back to her old self Progress towards PT goals: Progressing toward goals    Frequency       PT Plan Current plan  remains appropriate    Co-evaluation             End of Session Equipment Utilized During Treatment: Gait belt Activity Tolerance: Patient tolerated treatment well Patient left: in chair;with call bell/phone within reach;with family/visitor present     Time: [id number] PT  Time Calculation (min) (ACUTE ONLY): 20 min  Charges:  $Gait Training: 8-22 mins $Therapeutic Exercise: 8-22 mins                    G Codes:      Melford Aase 12-05-15, 11:43 AM Elwyn Reach, [place]

## 2015-11-24 LAB — CBC
HCT: 31.2 % — ABNORMAL LOW (ref 36.0–46.0)
Hemoglobin: 9.9 g/dL — ABNORMAL LOW (ref 12.0–15.0)
MCH: 28.9 pg (ref 26.0–34.0)
MCHC: 31.7 g/dL (ref 30.0–36.0)
MCV: 91 fL (ref 78.0–100.0)
Platelets: 214 10*3/uL (ref 150–400)
RBC: 3.43 MIL/uL — AB (ref 3.87–5.11)
RDW: 13.9 % (ref 11.5–15.5)
WBC: 8.9 10*3/uL (ref 4.0–10.5)

## 2015-11-24 LAB — BASIC METABOLIC PANEL
Anion gap: 7 (ref 5–15)
BUN: 11 mg/dL (ref 6–20)
CHLORIDE: 104 mmol/L (ref 101–111)
CO2: 26 mmol/L (ref 22–32)
Calcium: 8.5 mg/dL — ABNORMAL LOW (ref 8.9–10.3)
Creatinine, Ser: 0.9 mg/dL (ref 0.44–1.00)
GFR calc non Af Amer: >60 mL/min (ref >60)
Glucose, Bld: 98 mg/dL (ref 65–99)
POTASSIUM: 3.7 mmol/L (ref 3.5–5.1)
SODIUM: 137 mmol/L (ref 135–145)

## 2015-11-24 MED ORDER — ENOXAPARIN SODIUM 30 MG/0.3ML ~~LOC~~ SOLN: 30.0000 mg | SUBCUTANEOUS | Status: DC

## 2015-11-24 MED ORDER — DOCUSATE SODIUM 100 MG PO CAPS: 100.0000 mg | ORAL_CAPSULE | Freq: Two times a day (BID) | ORAL | Status: DC

## 2015-11-24 MED ORDER — POLYETHYLENE GLYCOL 3350 17 G PO PACK: PACK | ORAL | Status: DC

## 2015-11-24 MED ORDER — OXYCODONE HCL 15 MG PO TABS: ORAL_TABLET | ORAL | Status: DC

## 2015-11-24 NOTE — Progress Notes (Signed)
Pt ready for d/c home per MD. Cleared by therapies; equipment has been delivered to house. Discharge instructions and prescriptions reviewed with pt and daughter, denied questions and voiced understanding. Peripheral IV removed, belongings gathered and will be sent with family.   [place], Jerry Caras

## 2015-11-24 NOTE — Progress Notes (Signed)
OT Cancellation Note  Patient Details Name: Keary Fassler MRN: [id number] DOB: [birth year]   Cancelled Treatment:    Reason Eval/Treat Not Completed: Other (comment) Pt reports she has no OT concerns and would like OT to sign off.   Benito Mccreedy  OTR/L [id number]  11/24/2015, 10:16 AM

## 2015-11-24 NOTE — Discharge Summary (Signed)
Patient ID: Kelly Montes MRN: [id number] DOB/AGE: [birth year] 65 y.o.  Admit date: 11/22/2015 Discharge date: 11/24/2015  Admission Diagnoses:  Principal Problem:   Primary localized osteoarthritis of right knee Active Problems:   Depression   GERD   Disorder of bone and cartilage   Liver function abnormality   DJD (degenerative joint disease) of knee   Discharge Diagnoses:  Same  Past Medical History  Diagnosis Date  . GERD (gastroesophageal reflux disease)   . Depression   . Macular hole   . Primary localized osteoarthritis of right knee   . Kidney stone   . History of hiatal hernia     Surgeries: Procedure(s): TOTAL KNEE ARTHROPLASTY on 11/22/2015   Consultants:    Discharged Condition: Improved  Hospital Course: Kelly Montes is an 65 y.o. female who was admitted 11/22/2015 for operative treatment ofPrimary localized osteoarthritis of right knee. Patient has severe unremitting pain that affects sleep, daily activities, and work/hobbies. After pre-op clearance the patient was taken to the operating room on 11/22/2015 and underwent  Procedure(s): TOTAL KNEE ARTHROPLASTY.    Patient was given perioperative antibiotics: Anti-infectives    Start     Dose/Rate Route Frequency Ordered Stop   11/22/15 1345  ceFAZolin (ANCEF) IVPB 2 g/50 mL premix     2 g 100 mL/hr over 30 Minutes Intravenous Every 6 hours 11/22/15 1331 11/22/15 1902   11/22/15 0700  ceFAZolin (ANCEF) IVPB 2 g/50 mL premix     2 g 100 mL/hr over 30 Minutes Intravenous To ShortStay Surgical 11/19/15 1321 11/22/15 0734       Patient was given sequential compression devices, early ambulation, and chemoprophylaxis to prevent DVT.  Patient benefited maximally from hospital stay and there were no complications.    Recent vital signs: Patient Vitals for the past 24 hrs:  BP Temp Temp src Pulse Resp SpO2  11/24/15 0503 (!) 112/55 mmHg 98.8 F (37.1 C) Oral 86 18 93 %  11/23/15 2102 (!) 136/56  mmHg 99.4 F (37.4 C) Oral 79 16 95 %  11/23/15 2025 - 99.5 F (37.5 C) Oral - - -  11/23/15 1407 126/64 mmHg 99.2 F (37.3 C) Oral 80 16 96 %     Recent laboratory studies:  Recent Labs  11/23/15 0630 11/24/15 0645  WBC 10.8* 8.9  HGB 11.2* 9.9*  HCT 35.1* 31.2*  PLT 233 214  NA 137 137  K 4.1 3.7  CL 104 104  CO2 23 26  BUN 7 11  CREATININE 0.74 0.90  GLUCOSE 157* 98  CALCIUM 8.3* 8.5*     Discharge Medications:     Medication List    STOP taking these medications        naproxen sodium 220 MG tablet  Commonly known as:  ANAPROX      TAKE these medications        docusate sodium 100 MG capsule  Commonly known as:  COLACE  Take 1 capsule (100 mg total) by mouth 2 (two) times daily.     enoxaparin 30 MG/0.3ML injection  Commonly known as:  LOVENOX  Inject 0.3 mLs (30 mg total) into the skin daily.     escitalopram 10 MG tablet  Commonly known as:  LEXAPRO  Take 10 mg by mouth daily.     oxyCODONE 15 MG immediate release tablet  Commonly known as:  ROXICODONE  1 tab every 3-6 hrs as needed for pain     polyethylene glycol packet  Commonly known as:  MIRALAX /  GLYCOLAX  17grams in 16 oz of water twice a day until bowel movement.  LAXITIVE.  Restart if two days since last bowel movement     RABEprazole 20 MG tablet  Commonly known as:  ACIPHEX  Take 20 mg by mouth daily.        Diagnostic Studies: No results found.  Disposition: 01-Home or Self Care      Discharge Instructions    CPM    Complete by:  As directed   Continuous passive motion machine (CPM):      Use the CPM from 0 to 90 for 6 hours per day.       You may break it up into 2 or 3 sessions per day.      Use CPM for 2 weeks or until you are told to stop.     Call MD / Call 911    Complete by:  As directed   If you experience chest pain or shortness of breath, CALL 911 and be transported to the hospital emergency room.  If you develope a fever above 101 F, pus (white drainage) or  increased drainage or redness at the wound, or calf pain, call your surgeon's office.     Change dressing    Complete by:  As directed   Change the gauze dressing daily with sterile 4 x 4 inch gauze and apply TED hose.  DO NOT REMOVE BANDAGE OVER SURGICAL INCISION.  [place] WHOLE LEG INCLUDING OVER THE WATERPROOF BANDAGE WITH SOAP AND WATER EVERY DAY.     Constipation Prevention    Complete by:  As directed   Drink plenty of fluids.  Prune juice may be helpful.  You may use a stool softener, such as Colace (over the counter) 100 mg twice a day.  Use MiraLax (over the counter) for constipation as needed.     Diet - low sodium heart healthy    Complete by:  As directed      Discharge instructions    Complete by:  As directed   INSTRUCTIONS AFTER JOINT REPLACEMENT   Remove items at home which could result in a fall. This includes throw rugs or furniture in walking pathways ICE to the affected joint every three hours while awake for 30 minutes at a time, for at least the first 3-5 days, and then as needed for pain and swelling.  Continue to use ice for pain and swelling. You may notice swelling that will progress down to the foot and ankle.  This is normal after surgery.  Elevate your leg when you are not up walking on it.   Continue to use the breathing machine you got in the hospital (incentive spirometer) which will help keep your temperature down.  It is common for your temperature to cycle up and down following surgery, especially at night when you are not up moving around and exerting yourself.  The breathing machine keeps your lungs expanded and your temperature down.   DIET:  As you were doing prior to hospitalization, we recommend a well-balanced diet.  DRESSING / WOUND CARE / SHOWERING  Keep the surgical dressing until follow up.  The dressing is water proof, so you can shower without any extra covering.  IF THE DRESSING FALLS OFF or the wound gets wet inside, change the dressing with  sterile gauze.  Please use good hand washing techniques before changing the dressing.  Do not use any lotions or creams on the incision until instructed by your surgeon.  ACTIVITY  Increase activity slowly as tolerated, but follow the weight bearing instructions below.   No driving for 6 weeks or until further direction given by your physician.  You cannot drive while taking narcotics.  No lifting or carrying greater than 10 lbs. until further directed by your surgeon. Avoid periods of inactivity such as sitting longer than an hour when not asleep. This helps prevent blood clots.  You may return to work once you are authorized by your doctor.     WEIGHT BEARING   Weight bearing as tolerated with assist device (walker, cane, etc) as directed, use it as long as suggested by your surgeon or therapist, typically at least 2-4 weeks.   EXERCISES  Results after joint replacement surgery are often greatly improved when you follow the exercise, range of motion and muscle strengthening exercises prescribed by your doctor. Safety measures are also important to protect the joint from further injury. Any time any of these exercises cause you to have increased pain or swelling, decrease what you are doing until you are comfortable again and then slowly increase them. If you have problems or questions, call your caregiver or physical therapist for advice.   Rehabilitation is important following a joint replacement. After just a few days of immobilization, the muscles of the leg can become weakened and shrink (atrophy).  These exercises are designed to build up the tone and strength of the thigh and leg muscles and to improve motion. Often times heat used for twenty to thirty minutes before working out will loosen up your tissues and help with improving the range of motion but do not use heat for the first two weeks following surgery (sometimes heat can increase post-operative swelling).   These exercises  can be done on a training (exercise) mat, on the floor, on a table or on a bed. Use whatever works the best and is most comfortable for you.    Use music or television while you are exercising so that the exercises are a pleasant break in your day. This will make your life better with the exercises acting as a break in your routine that you can look forward to.   Perform all exercises about fifteen times, three times per day or as directed.  You should exercise both the operative leg and the other leg as well.   Exercises include:   Quad Sets - Tighten up the muscle on the front of the thigh (Quad) and hold for 5-10 seconds.   Straight Leg Raises - With your knee straight (if you were given a brace, keep it on), lift the leg to 60 degrees, hold for 3 seconds, and slowly lower the leg.  Perform this exercise against resistance later as your leg gets stronger.  Leg Slides: Lying on your back, slowly slide your foot toward your buttocks, bending your knee up off the floor (only go as far as is comfortable). Then slowly slide your foot back down until your leg is flat on the floor again.  Angel Wings: Lying on your back spread your legs to the side as far apart as you can without causing discomfort.  Hamstring Strength:  Lying on your back, push your heel against the floor with your leg straight by tightening up the muscles of your buttocks.  Repeat, but this time bend your knee to a comfortable angle, and push your heel against the floor.  You may put a pillow under the heel to make it more comfortable if necessary.  A rehabilitation program following joint replacement surgery can speed recovery and prevent re-injury in the future due to weakened muscles. Contact your doctor or a physical therapist for more information on knee rehabilitation.    CONSTIPATION  Constipation is defined medically as fewer than three stools per week and severe constipation as less than one stool per week.  Even if you have a  regular bowel pattern at home, your normal regimen is likely to be disrupted due to multiple reasons following surgery.  Combination of anesthesia, postoperative narcotics, change in appetite and fluid intake all can affect your bowels.   YOU MUST use at least one of the following options; they are listed in order of increasing strength to get the job done.  They are all available over the counter, and you may need to use some, POSSIBLY even all of these options:    Drink plenty of fluids (prune juice may be helpful) and high fiber foods Colace 100 mg by mouth twice a day  Senokot for constipation as directed and as needed Dulcolax (bisacodyl), take with full glass of water  Miralax (polyethylene glycol) once or twice a day as needed.  If you have tried all these things and are unable to have a bowel movement in the first 3-4 days after surgery call either your surgeon or your primary doctor.    If you experience loose stools or diarrhea, hold the medications until you stool forms back up.  If your symptoms do not get better within 1 week or if they get worse, check with your doctor.  If you experience "the worst abdominal pain ever" or develop nausea or vomiting, please contact the office immediately for further recommendations for treatment.   ITCHING:  If you experience itching with your medications, try taking only a single pain pill, or even half a pain pill at a time.  You can also use Benadryl over the counter for itching or also to help with sleep.   TED HOSE STOCKINGS:  Use stockings on both legs until for at least 2 weeks or as directed by physician office. They may be removed at night for sleeping.  MEDICATIONS:  See your medication summary on the "After Visit Summary" that nursing will review with you.  You may have some home medications which will be placed on hold until you complete the course of blood thinner medication.  It is important for you to complete the blood thinner  medication as prescribed.  PRECAUTIONS:  If you experience chest pain or shortness of breath - call 911 immediately for transfer to the hospital emergency department.   If you develop a fever greater that 101 F, purulent drainage from wound, increased redness or drainage from wound, foul odor from the wound/dressing, or calf pain - CONTACT YOUR SURGEON.                                                   FOLLOW-UP APPOINTMENTS:  If you do not already have a post-op appointment, please call the office for an appointment to be seen by your surgeon.  Guidelines for how soon to be seen are listed in your "After Visit Summary", but are typically between 1-4 weeks after surgery.  OTHER INSTRUCTIONS:   Knee Replacement:  Do not place pillow under knee, focus on keeping the knee straight while resting.  CPM instructions: 0-90 degrees, 2 hours in the morning, 2 hours in the afternoon, and 2 hours in the evening. Place foam block, curve side up under heel at all times except when in CPM or when walking.  DO NOT modify, tear, cut, or change the foam block in any way.  MAKE SURE YOU:  Understand these instructions.  Get help right away if you are not doing well or get worse.    Thank you for letting us be a part of your medical care team.  It is a privilege we respect greatly.  We hope these instructions will help you stay on track for a fast and full recovery!     Do not put a pillow under the knee. Place it under the heel.    Complete by:  As directed   Place gray foam block, curve side up under heel at all times except when in CPM or when walking.  DO NOT modify, tear, cut, or change in any way the gray foam block.     Increase activity slowly as tolerated    Complete by:  As directed      TED hose    Complete by:  As directed   Use stockings (TED hose) for 2 weeks on both leg(s).  You may remove them at night for sleeping.           Follow-up Information    Follow up with Lorn Junes, MD On  12/07/2015.   Specialty:  Orthopedic Surgery   Why:  appt time 3pm in [facility] office   Contact information:   [address] [place] [address] [phone]        Signed: Linda Hedges 11/24/2015, 11:10 AM

## 2015-11-24 NOTE — Progress Notes (Signed)
Physical Therapy Treatment Patient Details Name: Stephane Cristea MRN: [id number] DOB: [birth year] Today's Date: 11/24/2015    History of Present Illness Pt admitted for R TKA. PMHx: GERD, OA, depression, wrist surgery.    PT Comments    Patient is making good progress with PT.  From a mobility standpoint anticipate patient will be ready for DC home when medically ready.     Follow Up Recommendations  Home health PT     Equipment Recommendations  None recommended by PT    Recommendations for Other Services OT consult     Precautions / Restrictions Precautions Precautions: Fall;Knee Restrictions Weight Bearing Restrictions: Yes RLE Weight Bearing: Weight bearing as tolerated    Mobility  Bed Mobility               General bed mobility comments: in chair on arrival  Transfers Overall transfer level: Needs assistance Equipment used: Rolling walker (2 wheeled) Transfers: Sit to/from Stand Sit to Stand: Supervision         General transfer comment: carry over of hand placement and technique; supervision for safety  Ambulation/Gait Ambulation/Gait assistance: Supervision Ambulation Distance (Feet): 300 Feet Assistive device: Rolling walker (2 wheeled) Gait Pattern/deviations: Step-through pattern   Gait velocity interpretation: Below normal speed for age/gender General Gait Details: vc for facilitation of heel strike and increased R knee flexion initially with ability to correct throughout; pt with good sequencing and safe use of RW   Stairs            Wheelchair Mobility    Modified Rankin (Stroke Patients Only)       Balance Overall balance assessment: Needs assistance Sitting-balance support: No upper extremity supported;Feet supported Sitting balance-Leahy Scale: Good     Standing balance support: During functional activity Standing balance-Leahy Scale: Fair                      Cognition Arousal/Alertness:  Awake/alert Behavior During Therapy: WFL for tasks assessed/performed Overall Cognitive Status: Within Functional Limits for tasks assessed                      Exercises Total Joint Exercises Quad Sets: AROM;Right;10 reps;Seated Short Arc Quad: AROM;Right;10 reps;Seated Heel Slides: AROM;Right;10 reps;Seated Goniometric ROM: 0-85    General Comments        Pertinent Vitals/Pain Pain Assessment: 0-10 Pain Score: 6  Pain Location: r knee  Pain Descriptors / Indicators: Sore Pain Intervention(s): Limited activity within patient's tolerance;Monitored during session;Premedicated before session;Repositioned    Home Living                      Prior Function            PT Goals (current goals can now be found in the care plan section) Acute Rehab PT Goals Patient Stated Goal: go home Progress towards PT goals: Progressing toward goals    Frequency  7X/week    PT Plan Current plan remains appropriate    Co-evaluation             End of Session Equipment Utilized During Treatment: Gait belt Activity Tolerance: Patient tolerated treatment well Patient left: with call bell/phone within reach;with family/visitor present;in bed     Time: [id number] PT Time Calculation (min) (ACUTE ONLY): 23 min  Charges:  $Gait Training: 8-22 mins $Therapeutic Exercise: 8-22 mins  G Codes:      [place]ina April, PTA Pager: [phone]   11/24/2015, 12:18 PM

## 2016-01-20 ENCOUNTER — Telehealth: Payer: Self-pay

## 2016-01-20 NOTE — Telephone Encounter (Signed)
No, I have not received anything since 2016.

## 2016-01-20 NOTE — Telephone Encounter (Signed)
Kelly Montes from [facility] was following up on a referral that she faxed on Monday. I saw where a triage letter was mailed in March 2016, but nothing recent. Please advise if you receive anything from them

## 2016-01-20 NOTE — Telephone Encounter (Signed)
Error to previous note.  We have not received referral

## 2016-01-20 NOTE — Telephone Encounter (Signed)
I contacted PCP office and told them that triage nurse or myself have received referral and to please re-fax

## 2016-01-27 ENCOUNTER — Telehealth: Payer: Self-pay

## 2016-02-08 NOTE — Telephone Encounter (Signed)
Gastroenterology Pre-Procedure Review  Request Date: 01/27/2016 Requesting Physician: Dr. Manuella Ghazi  PATIENT REVIEW QUESTIONS: The patient responded to the following health history questions as indicated:    1. Diabetes Melitis: no 2. Joint replacements in the past 12 months: Right knee in Jan 2017 3. Major health problems in the past 3 months: no 4. Has an artificial valve or MVP: no 5. Has a defibrillator: no 6. Has been advised in past to take antibiotics in advance of a procedure like teeth cleaning: no 7. Family history of colon cancer: no  8. Alcohol Use: no 9. History of sleep apnea: no     MEDICATIONS & ALLERGIES:    Patient reports the following regarding taking any blood thinners:   Plavix? no Aspirin? YES Coumadin? no  Patient confirms/reports the following medications:  Current Outpatient Prescriptions  Medication Sig Dispense Refill  . aspirin 325 MG tablet Take 325 mg by mouth daily.    Marland Kitchen escitalopram (LEXAPRO) 10 MG tablet Take 20 mg by mouth daily.     . RABEprazole (ACIPHEX) 20 MG tablet Take 20 mg by mouth daily.    Marland Kitchen docusate sodium (COLACE) 100 MG capsule Take 1 capsule (100 mg total) by mouth 2 (two) times daily. (Patient not taking: Reported on 01/27/2016) 10 capsule 0  . enoxaparin (LOVENOX) 30 MG/0.3ML injection Inject 0.3 mLs (30 mg total) into the skin daily. (Patient not taking: Reported on 01/27/2016) 15 Syringe 0  . oxyCODONE (ROXICODONE) 15 MG immediate release tablet 1 tab every 3-6 hrs as needed for pain (Patient not taking: Reported on 01/27/2016) 100 tablet 0  . polyethylene glycol (MIRALAX / GLYCOLAX) packet 17grams in 16 oz of water twice a day until bowel movement.  LAXITIVE.  Restart if two days since last bowel movement (Patient not taking: Reported on 01/27/2016) 60 each 0   No current facility-administered medications for this visit.    Patient confirms/reports the following allergies:  Allergies  Allergen Reactions  . Sulfonamide Derivatives  Nausea And Vomiting    No orders of the defined types were placed in this encounter.    AUTHORIZATION INFORMATION Primary Insurance:   ID #:   Group #:  Pre-Cert / Auth required:  Pre-Cert / Auth #:   Secondary Insurance:   ID #:  Group #:  Pre-Cert / Auth required:  Pre-Cert / Auth #:   SCHEDULE INFORMATION: Procedure has been scheduled as follows:  Date:  02/23/2016              Time: 9:30 AM Location: [facility]  This Gastroenterology Pre-Precedure Review Form is being routed to the following provider(s): R. Garfield Cornea, MD

## 2016-02-09 NOTE — Telephone Encounter (Signed)
Will need OV due to meds (Roxicodone and Lexapro)

## 2016-02-10 NOTE — Telephone Encounter (Signed)
I spoke to Brewer and pt is NOT taking the narcotic, just the antidepsresant. OK to scheduled for the colonoscopy.

## 2016-02-15 ENCOUNTER — Other Ambulatory Visit: Payer: Self-pay

## 2016-02-15 DIAGNOSIS — Z1211 Encounter for screening for malignant neoplasm of colon: Secondary | ICD-10-CM

## 2016-02-15 MED ORDER — PEG 3350-KCL-NA BICARB-NACL 420 G PO SOLR: 4000.0000 mL | ORAL | Status: DC

## 2016-02-15 NOTE — Telephone Encounter (Signed)
Rx sent to the hospital and instructions mailed to pt.

## 2016-02-17 ENCOUNTER — Telehealth: Payer: Self-pay

## 2016-02-17 NOTE — Telephone Encounter (Signed)
PA# [id number] for the screening colonoscopy.

## 2016-02-23 ENCOUNTER — Encounter (HOSPITAL_COMMUNITY)
Admission: RE | Disposition: A | Discharge status: Home / Self Care | Payer: Self-pay | Source: Ambulatory Visit | Attending: Internal Medicine

## 2016-02-23 ENCOUNTER — Ambulatory Visit (HOSPITAL_COMMUNITY)
Admission: RE | Admit: 2016-02-23 | Discharge: 2016-02-23 | Disposition: A | Discharge status: Home / Self Care | Payer: Commercial Managed Care - HMO | Source: Ambulatory Visit | Attending: Internal Medicine | Admitting: Internal Medicine

## 2016-02-23 ENCOUNTER — Encounter (HOSPITAL_COMMUNITY): Payer: Self-pay | Admitting: *Deleted

## 2016-02-23 DIAGNOSIS — K219 Gastro-esophageal reflux disease without esophagitis: Secondary | ICD-10-CM | POA: Diagnosis not present

## 2016-02-23 DIAGNOSIS — Z1211 Encounter for screening for malignant neoplasm of colon: Secondary | ICD-10-CM | POA: Insufficient documentation

## 2016-02-23 DIAGNOSIS — Z7982 Long term (current) use of aspirin: Secondary | ICD-10-CM | POA: Diagnosis not present

## 2016-02-23 DIAGNOSIS — D122 Benign neoplasm of ascending colon: Secondary | ICD-10-CM | POA: Diagnosis not present

## 2016-02-23 DIAGNOSIS — Z79899 Other long term (current) drug therapy: Secondary | ICD-10-CM | POA: Diagnosis not present

## 2016-02-23 DIAGNOSIS — Z860100 Personal history of colon polyps, unspecified: Secondary | ICD-10-CM | POA: Insufficient documentation

## 2016-02-23 DIAGNOSIS — Z96651 Presence of right artificial knee joint: Secondary | ICD-10-CM | POA: Diagnosis not present

## 2016-02-23 DIAGNOSIS — F329 Major depressive disorder, single episode, unspecified: Secondary | ICD-10-CM | POA: Insufficient documentation

## 2016-02-23 DIAGNOSIS — K5730 Diverticulosis of large intestine without perforation or abscess without bleeding: Secondary | ICD-10-CM | POA: Insufficient documentation

## 2016-02-23 HISTORY — PX: COLONOSCOPY: SHX5424

## 2016-02-23 SURGERY — COLONOSCOPY
Anesthesia: Moderate Sedation

## 2016-02-23 MED ORDER — SODIUM CHLORIDE 0.9 % IV SOLN
INTRAVENOUS | Status: DC
  Administered 2016-02-23: 1000 mL via INTRAVENOUS

## 2016-02-23 MED ORDER — MEPERIDINE HCL 100 MG/ML IJ SOLN
INTRAMUSCULAR | Status: DC | PRN
  Administered 2016-02-23: 50 mg via INTRAVENOUS
  Administered 2016-02-23: 25 mg via INTRAVENOUS

## 2016-02-23 MED ORDER — STERILE WATER FOR IRRIGATION IR SOLN
Status: DC | PRN
  Administered 2016-02-23: 09:00:00

## 2016-02-23 MED ORDER — ONDANSETRON HCL 4 MG/2ML IJ SOLN
INTRAMUSCULAR | Status: DC | PRN
  Administered 2016-02-23: 4 mg via INTRAVENOUS

## 2016-02-23 MED ORDER — MIDAZOLAM HCL 5 MG/5ML IJ SOLN
INTRAMUSCULAR | Status: AC
  Filled 2016-02-23: qty 10

## 2016-02-23 MED ORDER — ONDANSETRON HCL 4 MG/2ML IJ SOLN
INTRAMUSCULAR | Status: AC
  Filled 2016-02-23: qty 2

## 2016-02-23 MED ORDER — MEPERIDINE HCL 100 MG/ML IJ SOLN
INTRAMUSCULAR | Status: AC
  Filled 2016-02-23: qty 2

## 2016-02-23 MED ORDER — MIDAZOLAM HCL 5 MG/5ML IJ SOLN
INTRAMUSCULAR | Status: DC | PRN
  Administered 2016-02-23 (×2): 2 mg via INTRAVENOUS

## 2016-02-23 NOTE — Op Note (Signed)
[facility] Patient Name: Kelly Montes Procedure Date: 02/23/2016 9:13 AM MRN: [id number] Date of Birth: [birth year] Attending MD: Norvel Richards , MD CSN: [id number] Age: 65 Admit Type: Outpatient Procedure:                Colonoscopy with biopsy Indications:              Screening for colorectal malignant neoplasm                            -Average risk Providers:                Norvel Richards, MD, Gwenlyn Fudge, RN, Randa Spike, Technician Referring MD:             Fuller Canada. Manuella Ghazi, MD Medicines:                Midazolam 4 mg IV, Meperidine 75 mg IV, Ondansetron                            4 mg IV Complications:            No immediate complications. Estimated Blood Loss:     Estimated blood loss was minimal. Procedure:                Pre-Anesthesia Assessment:                           - Prior to the procedure, a History and Physical                            was performed, and patient medications and                            allergies were reviewed. The patient's tolerance of                            previous anesthesia was also reviewed. The risks                            and benefits of the procedure and the sedation                            options and risks were discussed with the patient.                            All questions were answered, and informed consent                            was obtained. Prior Anticoagulants: The patient has                            taken no previous anticoagulant or antiplatelet  agents. ASA Grade Assessment: II - A patient with                            mild systemic disease. After reviewing the risks                            and benefits, the patient was deemed in                            satisfactory condition to undergo the procedure.                           After obtaining informed consent, the colonoscope                            was  passed under direct vision. Throughout the                            procedure, the patient's blood pressure, pulse, and                            oxygen saturations were monitored continuously. The                            [id number] scope was introduced through                            the anus and advanced to the the cecum, identified                            by appendiceal orifice and ileocecal valve. The                            colonoscopy was performed without difficulty. The                            patient tolerated the procedure well. The quality                            of the bowel preparation was adequate. The                            ileocecal valve, appendiceal orifice, and rectum                            were photographed. Scope In: 9:27:31 AM Scope Out: 9:41:35 AM Scope Withdrawal Time: 0 hours 7 minutes 46 seconds  Total Procedure Duration: 0 hours 14 minutes 4 seconds  Findings:      The perianal and digital rectal examinations were normal.      Scattered medium-mouthed diverticula were found in the sigmoid colon.      A 3 mm polyp was found in the ascending colon. The polyp was sessile.       The polyp was removed with a cold biopsy forceps.  Resection and       retrieval were complete. Estimated blood loss was minimal. Impression:               - Diverticulosis in the sigmoid colon.                           - One 3 mm polyp in the ascending colon, removed                            with a cold biopsy forceps. Resected and retrieved. Moderate Sedation:      Moderate (conscious) sedation was administered by the endoscopy nurse       and supervised by the endoscopist. The following parameters were       monitored: oxygen saturation, heart rate, blood pressure, respiratory       rate, EKG, adequacy of pulmonary ventilation, and response to care.       Total physician intraservice time was 20 minutes. Recommendation:           - Repeat  colonoscopy date to be determined after                            pending pathology results are reviewed for                            surveillance.                           - Return to GI office after studies are complete.                           - Advance diet as tolerated.                           - Continue present medications.                           - Advance diet as tolerated. Procedure Code(s):        --- Professional ---                           [phone], Colonoscopy, flexible; with biopsy, single                            or multiple                           99152, Moderate sedation services provided by the                            same physician or other qualified health care                            professional performing the diagnostic or                            therapeutic service that the sedation supports,  requiring the presence of an independent trained                            observer to assist in the monitoring of the                            patient's level of consciousness and physiological                            status; initial 15 minutes of intraservice time,                            patient age 39 years or older Diagnosis Code(s):        --- Professional ---                           Z12.11, Encounter for screening for malignant                            neoplasm of colon                           D12.2, Benign neoplasm of ascending colon                           K57.30, Diverticulosis of large intestine without                            perforation or abscess without bleeding CPT copyright 2016 American Medical Association. All rights reserved. The codes documented in this report are preliminary and upon coder review may  be revised to meet current compliance requirements. Cristopher Estimable. Zeenat Jeanbaptiste, MD Norvel Richards, MD 02/23/2016 9:51:55 AM This report has been signed electronically. Number of Addenda: 0

## 2016-02-23 NOTE — H&P (Signed)
@LOGO @   Primary Care Physician:  Monico Blitz, MD Primary Gastroenterologist:  Dr. Gala Romney  Pre-Procedure History & Physical: HPI:  Kelly Montes is a 65 y.o. female is here for a screening colonoscopy. Negative colonoscopy 10 years ago. No bowel symptoms. No family history of colon cancer.   Past Medical History  Diagnosis Date  . GERD (gastroesophageal reflux disease)   . Depression   . Macular hole   . Primary localized osteoarthritis of right knee   . Kidney stone   . History of hiatal hernia     Past Surgical History  Procedure Laterality Date  . Cesarean section    . Cholecystectomy    . Wrist surgery    . Colonoscopy    . Eye surgery      CATARACT REMOVAL  . Laparotomy    . Pars plana vitrectomy Right 03/01/2015    Procedure: PARS PLANA VITRECTOMY WITH 25 GAUGE WITH MEMBRANE PEEL;  Surgeon: Sherlynn Stalls, MD;  Location: [place];  Service: Ophthalmology;  Laterality: Right;  . Gas/fluid exchange Right 03/01/2015    Procedure: GAS/FLUID EXCHANGE;  Surgeon: Sherlynn Stalls, MD;  Location: [place];  Service: Ophthalmology;  Laterality: Right;  . Pars plana vitrectomy Left 04/26/2015    Procedure: PARS PLANA VITRECTOMY WITH 25 GAUGE;  Surgeon: Sherlynn Stalls, MD;  Location: [place];  Service: Ophthalmology;  Laterality: Left;  Marland Kitchen Membrane peel Left 04/26/2015    Procedure: MEMBRANE PEEL;  Surgeon: Sherlynn Stalls, MD;  Location: [place];  Service: Ophthalmology;  Laterality: Left;  . Gas/fluid exchange Left 04/26/2015    Procedure: GAS/FLUID EXCHANGE;  Surgeon: Sherlynn Stalls, MD;  Location: [place];  Service: Ophthalmology;  Laterality: Left;  . Photocoagulation with laser Left 04/26/2015    Procedure: PHOTOCOAGULATION WITH LASER;  Surgeon: Sherlynn Stalls, MD;  Location: [place];  Service: Ophthalmology;  Laterality: Left;  . Placement of breast implants      times two  . Total knee arthroplasty Right 11/22/2015    Procedure: TOTAL KNEE ARTHROPLASTY;  Surgeon: Elsie Saas, MD;  Location:  [place];  Service: Orthopedics;  Laterality: Right;    Prior to Admission medications   Medication Sig Start Date End Date Taking? Authorizing Provider  aspirin 81 MG tablet Take 81 mg by mouth daily.   Yes Historical Provider, MD  cholecalciferol (VITAMIN D) 1000 units tablet Take 1,000 Units by mouth daily.   Yes Historical Provider, MD  docusate sodium (COLACE) 100 MG capsule Take 1 capsule (100 mg total) by mouth 2 (two) times daily. 11/24/15  Yes Kirstin Shepperson, PA-C  escitalopram (LEXAPRO) 10 MG tablet Take 20 mg by mouth daily.    Yes Historical Provider, MD  oxyCODONE (ROXICODONE) 15 MG immediate release tablet 1 tab every 3-6 hrs as needed for pain 11/24/15  Yes Kirstin Shepperson, PA-C  polyethylene glycol-electrolytes (TRILYTE) 420 g solution Take 4,000 mLs by mouth as directed. 02/15/16  Yes Daneil Dolin, MD  RABEprazole (ACIPHEX) 20 MG tablet Take 20 mg by mouth daily.   Yes Historical Provider, MD  aspirin 325 MG tablet Take 325 mg by mouth daily.    Historical Provider, MD  enoxaparin (LOVENOX) 30 MG/0.3ML injection Inject 0.3 mLs (30 mg total) into the skin daily. Patient not taking: Reported on 01/27/2016 11/24/15   Kirstin Shepperson, PA-C  polyethylene glycol (MIRALAX / GLYCOLAX) packet 17grams in 16 oz of water twice a day until bowel movement.  LAXITIVE.  Restart if two days since last bowel movement Patient not taking: Reported  on 01/27/2016 11/24/15   Kirstin Shepperson, PA-C    Allergies as of 02/15/2016 - Review Complete 02/15/2016  Allergen Reaction Noted  . Sulfonamide derivatives Nausea And Vomiting     Family History  Problem Relation Age of Onset  . Coronary artery disease Father   . Arthritis Father   . Arthritis Sister   . Arthritis Brother     Social History   Social History  . Marital Status: Married    Spouse Name: N/A  . Number of Children: N/A  . Years of Education: N/A   Occupational History  . Not on file.   Social History Main Topics  .  Smoking status: Never Smoker   . Smokeless tobacco: Not on file  . Alcohol Use: No  . Drug Use: No  . Sexual Activity: Not on file   Other Topics Concern  . Not on file   Social History Narrative    Review of Systems: See HPI, otherwise negative ROS  Physical Exam: BP 117/72 mmHg  Pulse 60  Temp(Src) 98.5 F (36.9 C) (Oral)  Resp 19  Ht 5' 6.5" (1.689 m)  Wt 180 lb (81.647 kg)  BMI 28.62 kg/m2  SpO2 100% General:   Alert,  Well-developed, well-nourished, pleasant and cooperative in NAD Head:  Normocephalic and atraumatic. Eyes:  Sclera clear, no icterus.   Conjunctiva pink. ENeck:  Supple; no masses or thyromegaly. Lungs:  Clear throughout to auscultation.   No wheezes, crackles, or rhonchi. No acute distress. Heart:  Regular rate and rhythm; no murmurs, clicks, rubs,  or gallops. Abdomen:  Soft, nontender and nondistended. No masses, hepatosplenomegaly or hernias noted. Normal bowel sounds, without guarding, and without rebound.      Impression/Plan: Kelly Montes is now here to undergo a screening colonoscopy.  Average risk screening examination Risks, benefits, limitations, imponderables and alternatives regarding colonoscopy have been reviewed with the patient. Questions have been answered. All parties agreeable.           Notice:  This dictation was prepared with Dragon dictation along with smaller phrase technology. Any transcriptional errors that result from this process are unintentional and may not be corrected upon review.

## 2016-02-23 NOTE — Discharge Instructions (Signed)
°Colonoscopy °Discharge Instructions ° °Read the instructions outlined below and refer to this sheet in the next few weeks. These discharge instructions provide you with general information on caring for yourself after you leave the hospital. Your doctor may also give you specific instructions. While your treatment has been planned according to the most current medical practices available, unavoidable complications occasionally occur. If you have any problems or questions after discharge, call Dr. Rourk at [phone]. °ACTIVITY °· You may resume your regular activity, but move at a slower pace for the next 24 hours.  °· Take frequent rest periods for the next 24 hours.  °· Walking will help get rid of the air and reduce the bloated feeling in your belly (abdomen).  °· No driving for 24 hours (because of the medicine (anesthesia) used during the test).   °· Do not sign any important legal documents or operate any machinery for 24 hours (because of the anesthesia used during the test).  °NUTRITION °· Drink plenty of fluids.  °· You may resume your normal diet as instructed by your doctor.  °· Begin with a light meal and progress to your normal diet. Heavy or fried foods are harder to digest and may make you feel sick to your stomach (nauseated).  °· Avoid alcoholic beverages for 24 hours or as instructed.  °MEDICATIONS °· You may resume your normal medications unless your doctor tells you otherwise.  °WHAT YOU CAN EXPECT TODAY °· Some feelings of bloating in the abdomen.  °· Passage of more gas than usual.  °· Spotting of blood in your stool or on the toilet paper.  °IF YOU HAD POLYPS REMOVED DURING THE COLONOSCOPY: °· No aspirin products for 7 days or as instructed.  °· No alcohol for 7 days or as instructed.  °· Eat a soft diet for the next 24 hours.  °FINDING OUT THE RESULTS OF YOUR TEST °Not all test results are available during your visit. If your test results are not back during the visit, make an appointment  with your caregiver to find out the results. Do not assume everything is normal if you have not heard from your caregiver or the medical facility. It is important for you to follow up on all of your test results.  °SEEK IMMEDIATE MEDICAL ATTENTION IF: °· You have more than a spotting of blood in your stool.  °· Your belly is swollen (abdominal distention).  °· You are nauseated or vomiting.  °· You have a temperature over 101.  °· You have abdominal pain or discomfort that is severe or gets worse throughout the day.  ° °Diverticulosis and colon polyp information provided ° °Further recommendations to follow pending review of pathology report °Colon Polyps °Polyps are lumps of extra tissue growing inside the body. Polyps can grow in the large intestine (colon). Most colon polyps are noncancerous (benign). However, some colon polyps can become cancerous over time. Polyps that are larger than a pea may be harmful. To be safe, caregivers remove and test all polyps. °CAUSES  °Polyps form when mutations in the genes cause your cells to grow and divide even though no more tissue is needed. °RISK FACTORS °There are a number of risk factors that can increase your chances of getting colon polyps. They include: °· Being older than 50 years. °· Family history of colon polyps or colon cancer. °· Long-term colon diseases, such as colitis or Crohn disease. °· Being overweight. °· Smoking. °· Being inactive. °· Drinking too much alcohol. °  body. Polyps can grow in the large intestine (colon). Most colon polyps are noncancerous (benign). However, some colon polyps can become cancerous over time. Polyps that are larger than a pea may be harmful. To be safe, caregivers remove and test all polyps. °CAUSES  °Polyps form when mutations in the genes cause your cells to grow and divide even though no more tissue is needed. °RISK FACTORS °There are a number of risk factors that can increase your chances of getting colon polyps. They include: °· Being older than 50 years. °· Family history of colon polyps or colon cancer. °· Long-term colon diseases, such as colitis or Crohn disease. °· Being overweight. °· Smoking. °· Being inactive. °· Drinking too much alcohol. °SYMPTOMS  °Most small polyps do not cause symptoms. If symptoms are present, they may include: °· Blood in the stool. The stool may look dark red or black. °· Constipation or diarrhea that lasts longer than 1 week. °DIAGNOSIS °People often do not know they have polyps until their caregiver finds them during a regular checkup. Your caregiver can use 4 tests to check for  polyps: °· Digital rectal exam. The caregiver wears gloves and feels inside the rectum. This test would find polyps only in the rectum. °· Barium enema. The caregiver puts a liquid called barium into your rectum before taking X-rays of your colon. Barium makes your colon look white. Polyps are dark, so they are easy to see in the X-ray pictures. °· Sigmoidoscopy. A thin, flexible tube (sigmoidoscope) is placed into your rectum. The sigmoidoscope has a light and tiny camera in it. The caregiver uses the sigmoidoscope to look at the last third of your colon. °· Colonoscopy. This test is like sigmoidoscopy, but the caregiver looks at the entire colon. This is the most common method for finding and removing polyps. °TREATMENT  °Any polyps will be removed during a sigmoidoscopy or colonoscopy. The polyps are then tested for cancer. °PREVENTION  °To help lower your risk of getting more colon polyps: °· Eat plenty of fruits and vegetables. Avoid eating fatty foods. °· Do not smoke. °· Avoid drinking alcohol. °· Exercise every day. °· Lose weight if recommended by your caregiver. °· Eat plenty of calcium and folate. Foods that are rich in calcium include milk, cheese, and broccoli. Foods that are rich in folate include chickpeas, kidney beans, and spinach. °HOME CARE INSTRUCTIONS °Keep all follow-up appointments as directed by your caregiver. You may need periodic exams to check for polyps. °SEEK MEDICAL CARE IF: °You notice bleeding during a bowel movement. °  °This information is not intended to replace advice given to you by your health care provider. Make sure you discuss any questions you have with your health care provider. °  °Document Released: 07/19/2004 Document Revised: 11/13/2014 Document Reviewed: 01/02/2012 °Elsevier Interactive Patient Education ©2016 Elsevier Inc. ° ° ° ° ° ° ° °Diverticulosis °Diverticulosis is the condition that develops when small pouches (diverticula) form in the wall of your colon. Your  colon, or large intestine, is where water is absorbed and stool is formed. The pouches form when the inside layer of your colon pushes through weak spots in the outer layers of your colon. °CAUSES  °No one knows exactly what causes diverticulosis. °RISK FACTORS °· Being older than 50. Your risk for this condition increases with age. Diverticulosis is rare in people younger than 40 years. By age 80, almost everyone has it. °· Eating a low-fiber diet. °· Being frequently constipated. °· Being overweight. °·   Not getting enough exercise. °· Smoking. °· Taking over-the-counter pain medicines, like aspirin and ibuprofen. °SYMPTOMS  °Most people with diverticulosis do not have symptoms. °DIAGNOSIS  °Because diverticulosis often has no symptoms, health care providers often discover the condition during an exam for other colon problems. In many cases, a health care provider will diagnose diverticulosis while using a flexible scope to examine the colon (colonoscopy). °TREATMENT  °If you have never developed an infection related to diverticulosis, you may not need treatment. If you have had an infection before, treatment may include: °· Eating more fruits, vegetables, and grains. °· Taking a fiber supplement. °· Taking a live bacteria supplement (probiotic). °· Taking medicine to relax your colon. °HOME CARE INSTRUCTIONS  °· Drink at least 6-8 glasses of water each day to prevent constipation. °· Try not to strain when you have a bowel movement. °· Keep all follow-up appointments. °If you have had an infection before:  °· Increase the fiber in your diet as directed by your health care provider or dietitian. °· Take a dietary fiber supplement if your health care provider approves. °· Only take medicines as directed by your health care provider. °SEEK MEDICAL CARE IF:  °· You have abdominal pain. °· You have bloating. °· You have cramps. °· You have not gone to the bathroom in 3 days. °SEEK IMMEDIATE MEDICAL CARE IF:  °· Your  pain gets worse. °· Your bloating becomes very bad. °· You have a fever or chills, and your symptoms suddenly get worse. °· You begin vomiting. °· You have bowel movements that are bloody or black. °MAKE SURE YOU: °· Understand these instructions. °· Will watch your condition. °· Will get help right away if you are not doing well or get worse. °  °This information is not intended to replace advice given to you by your health care provider. Make sure you discuss any questions you have with your health care provider. °  °Document Released: 07/20/2004 Document Revised: 10/28/2013 Document Reviewed: 09/17/2013 °Elsevier Interactive Patient Education ©2016 Elsevier Inc. ° ° °

## 2016-02-25 ENCOUNTER — Encounter: Payer: Self-pay | Admitting: Internal Medicine

## 2016-02-28 ENCOUNTER — Encounter (HOSPITAL_COMMUNITY): Payer: Self-pay | Admitting: Internal Medicine

## 2016-11-28 DIAGNOSIS — M1711 Unilateral primary osteoarthritis, right knee: Secondary | ICD-10-CM | POA: Diagnosis not present

## 2016-12-06 DIAGNOSIS — R300 Dysuria: Secondary | ICD-10-CM | POA: Diagnosis not present

## 2016-12-06 DIAGNOSIS — J111 Influenza due to unidentified influenza virus with other respiratory manifestations: Secondary | ICD-10-CM | POA: Diagnosis not present

## 2016-12-06 DIAGNOSIS — R509 Fever, unspecified: Secondary | ICD-10-CM | POA: Diagnosis not present

## 2017-01-18 DIAGNOSIS — R5383 Other fatigue: Secondary | ICD-10-CM | POA: Diagnosis not present

## 2017-01-18 DIAGNOSIS — N393 Stress incontinence (female) (male): Secondary | ICD-10-CM | POA: Diagnosis not present

## 2017-01-18 DIAGNOSIS — Z01419 Encounter for gynecological examination (general) (routine) without abnormal findings: Secondary | ICD-10-CM | POA: Diagnosis not present

## 2017-01-18 DIAGNOSIS — Z6828 Body mass index (BMI) 28.0-28.9, adult: Secondary | ICD-10-CM | POA: Diagnosis not present

## 2017-01-18 DIAGNOSIS — Z79899 Other long term (current) drug therapy: Secondary | ICD-10-CM | POA: Diagnosis not present

## 2017-01-18 DIAGNOSIS — Z0000 Encounter for general adult medical examination without abnormal findings: Secondary | ICD-10-CM | POA: Diagnosis not present

## 2017-01-18 DIAGNOSIS — Z7189 Other specified counseling: Secondary | ICD-10-CM | POA: Diagnosis not present

## 2017-01-18 DIAGNOSIS — F329 Major depressive disorder, single episode, unspecified: Secondary | ICD-10-CM | POA: Diagnosis not present

## 2017-01-18 DIAGNOSIS — Z1389 Encounter for screening for other disorder: Secondary | ICD-10-CM | POA: Diagnosis not present

## 2017-01-18 DIAGNOSIS — R350 Frequency of micturition: Secondary | ICD-10-CM | POA: Diagnosis not present

## 2017-01-18 DIAGNOSIS — N390 Urinary tract infection, site not specified: Secondary | ICD-10-CM | POA: Diagnosis not present

## 2017-01-18 DIAGNOSIS — Z299 Encounter for prophylactic measures, unspecified: Secondary | ICD-10-CM | POA: Diagnosis not present

## 2017-01-18 DIAGNOSIS — E7800 Pure hypercholesterolemia, unspecified: Secondary | ICD-10-CM | POA: Diagnosis not present

## 2017-04-09 DIAGNOSIS — G4711 Idiopathic hypersomnia with long sleep time: Secondary | ICD-10-CM | POA: Diagnosis not present

## 2017-05-20 DIAGNOSIS — J329 Chronic sinusitis, unspecified: Secondary | ICD-10-CM | POA: Diagnosis not present

## 2017-05-24 DIAGNOSIS — F329 Major depressive disorder, single episode, unspecified: Secondary | ICD-10-CM | POA: Diagnosis not present

## 2017-05-24 DIAGNOSIS — Z6830 Body mass index (BMI) 30.0-30.9, adult: Secondary | ICD-10-CM | POA: Diagnosis not present

## 2017-05-24 DIAGNOSIS — Z299 Encounter for prophylactic measures, unspecified: Secondary | ICD-10-CM | POA: Diagnosis not present

## 2017-05-24 DIAGNOSIS — J069 Acute upper respiratory infection, unspecified: Secondary | ICD-10-CM | POA: Diagnosis not present

## 2017-05-24 DIAGNOSIS — F909 Attention-deficit hyperactivity disorder, unspecified type: Secondary | ICD-10-CM | POA: Diagnosis not present

## 2017-07-24 DIAGNOSIS — Z6830 Body mass index (BMI) 30.0-30.9, adult: Secondary | ICD-10-CM | POA: Diagnosis not present

## 2017-07-24 DIAGNOSIS — Z299 Encounter for prophylactic measures, unspecified: Secondary | ICD-10-CM | POA: Diagnosis not present

## 2017-07-24 DIAGNOSIS — F329 Major depressive disorder, single episode, unspecified: Secondary | ICD-10-CM | POA: Diagnosis not present

## 2017-07-24 DIAGNOSIS — Z1389 Encounter for screening for other disorder: Secondary | ICD-10-CM | POA: Diagnosis not present

## 2017-07-24 DIAGNOSIS — Z713 Dietary counseling and surveillance: Secondary | ICD-10-CM | POA: Diagnosis not present

## 2017-07-24 DIAGNOSIS — M810 Age-related osteoporosis without current pathological fracture: Secondary | ICD-10-CM | POA: Diagnosis not present

## 2017-07-24 DIAGNOSIS — F909 Attention-deficit hyperactivity disorder, unspecified type: Secondary | ICD-10-CM | POA: Diagnosis not present

## 2017-07-24 DIAGNOSIS — Z1231 Encounter for screening mammogram for malignant neoplasm of breast: Secondary | ICD-10-CM | POA: Diagnosis not present

## 2017-07-24 DIAGNOSIS — E7800 Pure hypercholesterolemia, unspecified: Secondary | ICD-10-CM | POA: Diagnosis not present

## 2017-07-24 DIAGNOSIS — Z789 Other specified health status: Secondary | ICD-10-CM | POA: Diagnosis not present

## 2017-07-24 DIAGNOSIS — R252 Cramp and spasm: Secondary | ICD-10-CM | POA: Diagnosis not present

## 2017-07-25 DIAGNOSIS — E7800 Pure hypercholesterolemia, unspecified: Secondary | ICD-10-CM | POA: Diagnosis not present

## 2017-07-25 DIAGNOSIS — R252 Cramp and spasm: Secondary | ICD-10-CM | POA: Diagnosis not present

## 2017-09-25 DIAGNOSIS — R350 Frequency of micturition: Secondary | ICD-10-CM | POA: Diagnosis not present

## 2017-09-25 DIAGNOSIS — Z789 Other specified health status: Secondary | ICD-10-CM | POA: Diagnosis not present

## 2017-09-25 DIAGNOSIS — Z299 Encounter for prophylactic measures, unspecified: Secondary | ICD-10-CM | POA: Diagnosis not present

## 2017-09-25 DIAGNOSIS — Z6830 Body mass index (BMI) 30.0-30.9, adult: Secondary | ICD-10-CM | POA: Diagnosis not present

## 2017-09-25 DIAGNOSIS — J069 Acute upper respiratory infection, unspecified: Secondary | ICD-10-CM | POA: Diagnosis not present

## 2017-10-08 DIAGNOSIS — G4711 Idiopathic hypersomnia with long sleep time: Secondary | ICD-10-CM | POA: Diagnosis not present

## 2017-11-22 DIAGNOSIS — Z23 Encounter for immunization: Secondary | ICD-10-CM | POA: Diagnosis not present

## 2017-11-27 DIAGNOSIS — Z96651 Presence of right artificial knee joint: Secondary | ICD-10-CM | POA: Diagnosis not present

## 2018-01-22 DIAGNOSIS — Z299 Encounter for prophylactic measures, unspecified: Secondary | ICD-10-CM | POA: Diagnosis not present

## 2018-01-22 DIAGNOSIS — Z1339 Encounter for screening examination for other mental health and behavioral disorders: Secondary | ICD-10-CM | POA: Diagnosis not present

## 2018-01-22 DIAGNOSIS — Z1331 Encounter for screening for depression: Secondary | ICD-10-CM | POA: Diagnosis not present

## 2018-01-22 DIAGNOSIS — Z1211 Encounter for screening for malignant neoplasm of colon: Secondary | ICD-10-CM | POA: Diagnosis not present

## 2018-01-22 DIAGNOSIS — Z79899 Other long term (current) drug therapy: Secondary | ICD-10-CM | POA: Diagnosis not present

## 2018-01-22 DIAGNOSIS — R5383 Other fatigue: Secondary | ICD-10-CM | POA: Diagnosis not present

## 2018-01-22 DIAGNOSIS — Z7189 Other specified counseling: Secondary | ICD-10-CM | POA: Diagnosis not present

## 2018-01-22 DIAGNOSIS — F909 Attention-deficit hyperactivity disorder, unspecified type: Secondary | ICD-10-CM | POA: Diagnosis not present

## 2018-01-22 DIAGNOSIS — Z6831 Body mass index (BMI) 31.0-31.9, adult: Secondary | ICD-10-CM | POA: Diagnosis not present

## 2018-01-22 DIAGNOSIS — Z1231 Encounter for screening mammogram for malignant neoplasm of breast: Secondary | ICD-10-CM | POA: Diagnosis not present

## 2018-01-22 DIAGNOSIS — M8580 Other specified disorders of bone density and structure, unspecified site: Secondary | ICD-10-CM | POA: Diagnosis not present

## 2018-01-22 DIAGNOSIS — Z0000 Encounter for general adult medical examination without abnormal findings: Secondary | ICD-10-CM | POA: Diagnosis not present

## 2018-03-12 DIAGNOSIS — Z9882 Breast implant status: Secondary | ICD-10-CM | POA: Diagnosis not present

## 2018-03-12 DIAGNOSIS — Z1231 Encounter for screening mammogram for malignant neoplasm of breast: Secondary | ICD-10-CM | POA: Diagnosis not present

## 2018-03-19 DIAGNOSIS — Z6831 Body mass index (BMI) 31.0-31.9, adult: Secondary | ICD-10-CM | POA: Diagnosis not present

## 2018-03-19 DIAGNOSIS — M810 Age-related osteoporosis without current pathological fracture: Secondary | ICD-10-CM | POA: Diagnosis not present

## 2018-03-19 DIAGNOSIS — L237 Allergic contact dermatitis due to plants, except food: Secondary | ICD-10-CM | POA: Diagnosis not present

## 2018-03-19 DIAGNOSIS — Z789 Other specified health status: Secondary | ICD-10-CM | POA: Diagnosis not present

## 2018-03-19 DIAGNOSIS — Z299 Encounter for prophylactic measures, unspecified: Secondary | ICD-10-CM | POA: Diagnosis not present

## 2018-03-19 DIAGNOSIS — M8580 Other specified disorders of bone density and structure, unspecified site: Secondary | ICD-10-CM | POA: Diagnosis not present

## 2018-03-26 DIAGNOSIS — B379 Candidiasis, unspecified: Secondary | ICD-10-CM | POA: Diagnosis not present

## 2018-03-26 DIAGNOSIS — L237 Allergic contact dermatitis due to plants, except food: Secondary | ICD-10-CM | POA: Diagnosis not present

## 2018-03-26 DIAGNOSIS — F909 Attention-deficit hyperactivity disorder, unspecified type: Secondary | ICD-10-CM | POA: Diagnosis not present

## 2018-03-26 DIAGNOSIS — Z6830 Body mass index (BMI) 30.0-30.9, adult: Secondary | ICD-10-CM | POA: Diagnosis not present

## 2018-03-26 DIAGNOSIS — Z299 Encounter for prophylactic measures, unspecified: Secondary | ICD-10-CM | POA: Diagnosis not present

## 2018-04-08 DIAGNOSIS — G4711 Idiopathic hypersomnia with long sleep time: Secondary | ICD-10-CM | POA: Diagnosis not present

## 2018-08-01 DIAGNOSIS — Z299 Encounter for prophylactic measures, unspecified: Secondary | ICD-10-CM | POA: Diagnosis not present

## 2018-08-01 DIAGNOSIS — F329 Major depressive disorder, single episode, unspecified: Secondary | ICD-10-CM | POA: Diagnosis not present

## 2018-08-01 DIAGNOSIS — Z6832 Body mass index (BMI) 32.0-32.9, adult: Secondary | ICD-10-CM | POA: Diagnosis not present

## 2018-08-01 DIAGNOSIS — R350 Frequency of micturition: Secondary | ICD-10-CM | POA: Diagnosis not present

## 2018-08-01 DIAGNOSIS — F909 Attention-deficit hyperactivity disorder, unspecified type: Secondary | ICD-10-CM | POA: Diagnosis not present

## 2018-08-14 DIAGNOSIS — H43813 Vitreous degeneration, bilateral: Secondary | ICD-10-CM | POA: Diagnosis not present

## 2018-08-19 DIAGNOSIS — R109 Unspecified abdominal pain: Secondary | ICD-10-CM | POA: Diagnosis not present

## 2018-08-19 DIAGNOSIS — Z6832 Body mass index (BMI) 32.0-32.9, adult: Secondary | ICD-10-CM | POA: Diagnosis not present

## 2018-08-19 DIAGNOSIS — Z299 Encounter for prophylactic measures, unspecified: Secondary | ICD-10-CM | POA: Diagnosis not present

## 2018-08-19 DIAGNOSIS — F329 Major depressive disorder, single episode, unspecified: Secondary | ICD-10-CM | POA: Diagnosis not present

## 2018-08-19 DIAGNOSIS — F909 Attention-deficit hyperactivity disorder, unspecified type: Secondary | ICD-10-CM | POA: Diagnosis not present

## 2018-08-19 DIAGNOSIS — Z789 Other specified health status: Secondary | ICD-10-CM | POA: Diagnosis not present

## 2018-10-09 DIAGNOSIS — G4711 Idiopathic hypersomnia with long sleep time: Secondary | ICD-10-CM | POA: Diagnosis not present

## 2018-10-26 DIAGNOSIS — Z23 Encounter for immunization: Secondary | ICD-10-CM | POA: Diagnosis not present

## 2018-11-15 DIAGNOSIS — R350 Frequency of micturition: Secondary | ICD-10-CM | POA: Diagnosis not present

## 2018-11-15 DIAGNOSIS — F329 Major depressive disorder, single episode, unspecified: Secondary | ICD-10-CM | POA: Diagnosis not present

## 2018-11-15 DIAGNOSIS — F909 Attention-deficit hyperactivity disorder, unspecified type: Secondary | ICD-10-CM | POA: Diagnosis not present

## 2018-11-15 DIAGNOSIS — N390 Urinary tract infection, site not specified: Secondary | ICD-10-CM | POA: Diagnosis not present

## 2018-11-15 DIAGNOSIS — Z789 Other specified health status: Secondary | ICD-10-CM | POA: Diagnosis not present

## 2018-11-15 DIAGNOSIS — Z299 Encounter for prophylactic measures, unspecified: Secondary | ICD-10-CM | POA: Diagnosis not present

## 2018-11-15 DIAGNOSIS — Z6831 Body mass index (BMI) 31.0-31.9, adult: Secondary | ICD-10-CM | POA: Diagnosis not present

## 2018-12-02 DIAGNOSIS — F909 Attention-deficit hyperactivity disorder, unspecified type: Secondary | ICD-10-CM | POA: Diagnosis not present

## 2018-12-02 DIAGNOSIS — R350 Frequency of micturition: Secondary | ICD-10-CM | POA: Diagnosis not present

## 2018-12-02 DIAGNOSIS — Z6831 Body mass index (BMI) 31.0-31.9, adult: Secondary | ICD-10-CM | POA: Diagnosis not present

## 2018-12-02 DIAGNOSIS — N390 Urinary tract infection, site not specified: Secondary | ICD-10-CM | POA: Diagnosis not present

## 2018-12-02 DIAGNOSIS — F329 Major depressive disorder, single episode, unspecified: Secondary | ICD-10-CM | POA: Diagnosis not present

## 2018-12-02 DIAGNOSIS — Z299 Encounter for prophylactic measures, unspecified: Secondary | ICD-10-CM | POA: Diagnosis not present

## 2018-12-02 DIAGNOSIS — E7800 Pure hypercholesterolemia, unspecified: Secondary | ICD-10-CM | POA: Diagnosis not present

## 2018-12-10 DIAGNOSIS — N390 Urinary tract infection, site not specified: Secondary | ICD-10-CM | POA: Diagnosis not present

## 2018-12-10 DIAGNOSIS — R350 Frequency of micturition: Secondary | ICD-10-CM | POA: Diagnosis not present

## 2018-12-10 DIAGNOSIS — Z6831 Body mass index (BMI) 31.0-31.9, adult: Secondary | ICD-10-CM | POA: Diagnosis not present

## 2018-12-10 DIAGNOSIS — Z299 Encounter for prophylactic measures, unspecified: Secondary | ICD-10-CM | POA: Diagnosis not present

## 2018-12-10 DIAGNOSIS — N3281 Overactive bladder: Secondary | ICD-10-CM | POA: Diagnosis not present

## 2018-12-10 DIAGNOSIS — E7800 Pure hypercholesterolemia, unspecified: Secondary | ICD-10-CM | POA: Diagnosis not present

## 2019-03-21 DIAGNOSIS — Z299 Encounter for prophylactic measures, unspecified: Secondary | ICD-10-CM | POA: Diagnosis not present

## 2019-03-21 DIAGNOSIS — R0789 Other chest pain: Secondary | ICD-10-CM | POA: Diagnosis not present

## 2019-03-21 DIAGNOSIS — F909 Attention-deficit hyperactivity disorder, unspecified type: Secondary | ICD-10-CM | POA: Diagnosis not present

## 2019-03-21 DIAGNOSIS — R5383 Other fatigue: Secondary | ICD-10-CM | POA: Diagnosis not present

## 2019-03-21 DIAGNOSIS — Z6832 Body mass index (BMI) 32.0-32.9, adult: Secondary | ICD-10-CM | POA: Diagnosis not present

## 2019-03-21 DIAGNOSIS — R252 Cramp and spasm: Secondary | ICD-10-CM | POA: Diagnosis not present

## 2019-04-14 DIAGNOSIS — G4711 Idiopathic hypersomnia with long sleep time: Secondary | ICD-10-CM | POA: Diagnosis not present

## 2019-08-20 DIAGNOSIS — Z23 Encounter for immunization: Secondary | ICD-10-CM | POA: Diagnosis not present

## 2019-09-01 DIAGNOSIS — Z23 Encounter for immunization: Secondary | ICD-10-CM | POA: Diagnosis not present

## 2019-09-29 DIAGNOSIS — J069 Acute upper respiratory infection, unspecified: Secondary | ICD-10-CM | POA: Diagnosis not present

## 2019-09-29 DIAGNOSIS — Z299 Encounter for prophylactic measures, unspecified: Secondary | ICD-10-CM | POA: Diagnosis not present

## 2019-09-29 DIAGNOSIS — F329 Major depressive disorder, single episode, unspecified: Secondary | ICD-10-CM | POA: Diagnosis not present

## 2019-09-29 DIAGNOSIS — Z6830 Body mass index (BMI) 30.0-30.9, adult: Secondary | ICD-10-CM | POA: Diagnosis not present

## 2019-09-29 DIAGNOSIS — Z713 Dietary counseling and surveillance: Secondary | ICD-10-CM | POA: Diagnosis not present

## 2019-10-01 ENCOUNTER — Other Ambulatory Visit: Payer: Self-pay

## 2019-10-01 DIAGNOSIS — Z20822 Contact with and (suspected) exposure to covid-19: Secondary | ICD-10-CM

## 2019-10-03 ENCOUNTER — Ambulatory Visit: Payer: Self-pay

## 2019-10-03 LAB — NOVEL CORONAVIRUS, NAA: SARS-CoV-2, NAA: DETECTED — AB

## 2019-10-03 NOTE — Telephone Encounter (Signed)
Provided covid -19 test results provided care advice.   Reviewed isolatyion protocol voiced understanding.

## 2019-10-04 ENCOUNTER — Telehealth: Payer: Self-pay | Admitting: Unknown Physician Specialty

## 2019-10-04 NOTE — Telephone Encounter (Signed)
Called to discuss antibody treatment for Covid 19.  Unable to reach with mailbox full and other numbers not working

## 2019-10-09 ENCOUNTER — Telehealth: Payer: Self-pay | Admitting: *Deleted

## 2019-10-09 NOTE — Telephone Encounter (Signed)
Marcene Duos from [place] called to verify the patient's contact information,

## 2020-01-01 DIAGNOSIS — Z23 Encounter for immunization: Secondary | ICD-10-CM | POA: Diagnosis not present

## 2020-01-19 DIAGNOSIS — Z789 Other specified health status: Secondary | ICD-10-CM | POA: Diagnosis not present

## 2020-01-19 DIAGNOSIS — Z299 Encounter for prophylactic measures, unspecified: Secondary | ICD-10-CM | POA: Diagnosis not present

## 2020-01-19 DIAGNOSIS — F909 Attention-deficit hyperactivity disorder, unspecified type: Secondary | ICD-10-CM | POA: Diagnosis not present

## 2020-01-19 DIAGNOSIS — B3741 Candidal cystitis and urethritis: Secondary | ICD-10-CM | POA: Diagnosis not present

## 2020-01-19 DIAGNOSIS — R350 Frequency of micturition: Secondary | ICD-10-CM | POA: Diagnosis not present

## 2020-01-19 DIAGNOSIS — F325 Major depressive disorder, single episode, in full remission: Secondary | ICD-10-CM | POA: Diagnosis not present

## 2020-01-19 DIAGNOSIS — Z6831 Body mass index (BMI) 31.0-31.9, adult: Secondary | ICD-10-CM | POA: Diagnosis not present

## 2020-01-30 DIAGNOSIS — Z23 Encounter for immunization: Secondary | ICD-10-CM | POA: Diagnosis not present

## 2020-03-01 DIAGNOSIS — L237 Allergic contact dermatitis due to plants, except food: Secondary | ICD-10-CM | POA: Diagnosis not present

## 2020-03-11 DIAGNOSIS — L237 Allergic contact dermatitis due to plants, except food: Secondary | ICD-10-CM | POA: Diagnosis not present

## 2020-03-16 DIAGNOSIS — R41840 Attention and concentration deficit: Secondary | ICD-10-CM | POA: Diagnosis not present

## 2020-03-16 DIAGNOSIS — E559 Vitamin D deficiency, unspecified: Secondary | ICD-10-CM | POA: Diagnosis not present

## 2020-03-16 DIAGNOSIS — Z1231 Encounter for screening mammogram for malignant neoplasm of breast: Secondary | ICD-10-CM | POA: Diagnosis not present

## 2020-03-16 DIAGNOSIS — Z0000 Encounter for general adult medical examination without abnormal findings: Secondary | ICD-10-CM | POA: Diagnosis not present

## 2020-03-16 DIAGNOSIS — Z1211 Encounter for screening for malignant neoplasm of colon: Secondary | ICD-10-CM | POA: Diagnosis not present

## 2020-03-16 DIAGNOSIS — M8949 Other hypertrophic osteoarthropathy, multiple sites: Secondary | ICD-10-CM | POA: Diagnosis not present

## 2020-03-16 DIAGNOSIS — F330 Major depressive disorder, recurrent, mild: Secondary | ICD-10-CM | POA: Diagnosis not present

## 2020-06-09 DIAGNOSIS — G4711 Idiopathic hypersomnia with long sleep time: Secondary | ICD-10-CM | POA: Diagnosis not present

## 2020-06-21 DIAGNOSIS — D122 Benign neoplasm of ascending colon: Secondary | ICD-10-CM | POA: Diagnosis not present

## 2020-06-21 DIAGNOSIS — D125 Benign neoplasm of sigmoid colon: Secondary | ICD-10-CM | POA: Diagnosis not present

## 2020-06-21 DIAGNOSIS — Z1211 Encounter for screening for malignant neoplasm of colon: Secondary | ICD-10-CM | POA: Diagnosis not present

## 2020-06-21 DIAGNOSIS — K5730 Diverticulosis of large intestine without perforation or abscess without bleeding: Secondary | ICD-10-CM | POA: Diagnosis not present

## 2020-06-21 DIAGNOSIS — Z86010 Personal history of colonic polyps: Secondary | ICD-10-CM | POA: Diagnosis not present

## 2020-09-21 DIAGNOSIS — Z23 Encounter for immunization: Secondary | ICD-10-CM | POA: Diagnosis not present

## 2020-09-23 DIAGNOSIS — Z23 Encounter for immunization: Secondary | ICD-10-CM | POA: Diagnosis not present

## 2020-11-09 DIAGNOSIS — M8949 Other hypertrophic osteoarthropathy, multiple sites: Secondary | ICD-10-CM | POA: Diagnosis not present

## 2020-11-09 DIAGNOSIS — M25552 Pain in left hip: Secondary | ICD-10-CM | POA: Diagnosis not present

## 2020-11-09 DIAGNOSIS — Z780 Asymptomatic menopausal state: Secondary | ICD-10-CM | POA: Diagnosis not present

## 2020-11-09 DIAGNOSIS — M899 Disorder of bone, unspecified: Secondary | ICD-10-CM | POA: Diagnosis not present

## 2020-11-09 DIAGNOSIS — E559 Vitamin D deficiency, unspecified: Secondary | ICD-10-CM | POA: Diagnosis not present

## 2020-11-09 DIAGNOSIS — M25551 Pain in right hip: Secondary | ICD-10-CM | POA: Diagnosis not present

## 2020-11-09 DIAGNOSIS — Z23 Encounter for immunization: Secondary | ICD-10-CM | POA: Diagnosis not present

## 2020-11-09 DIAGNOSIS — Z13820 Encounter for screening for osteoporosis: Secondary | ICD-10-CM | POA: Diagnosis not present

## 2020-11-09 DIAGNOSIS — F330 Major depressive disorder, recurrent, mild: Secondary | ICD-10-CM | POA: Diagnosis not present

## 2020-11-28 DIAGNOSIS — R0602 Shortness of breath: Secondary | ICD-10-CM | POA: Diagnosis not present

## 2020-11-28 DIAGNOSIS — R059 Cough, unspecified: Secondary | ICD-10-CM | POA: Diagnosis not present

## 2020-11-28 DIAGNOSIS — U071 COVID-19: Secondary | ICD-10-CM | POA: Diagnosis not present

## 2020-12-01 DIAGNOSIS — J40 Bronchitis, not specified as acute or chronic: Secondary | ICD-10-CM | POA: Diagnosis not present

## 2020-12-01 DIAGNOSIS — U071 COVID-19: Secondary | ICD-10-CM | POA: Diagnosis not present

## 2020-12-01 DIAGNOSIS — Z8679 Personal history of other diseases of the circulatory system: Secondary | ICD-10-CM | POA: Diagnosis not present

## 2020-12-14 DIAGNOSIS — M7061 Trochanteric bursitis, right hip: Secondary | ICD-10-CM | POA: Diagnosis not present

## 2020-12-14 DIAGNOSIS — M47816 Spondylosis without myelopathy or radiculopathy, lumbar region: Secondary | ICD-10-CM | POA: Diagnosis not present

## 2020-12-15 DIAGNOSIS — G4711 Idiopathic hypersomnia with long sleep time: Secondary | ICD-10-CM | POA: Diagnosis not present

## 2020-12-17 DIAGNOSIS — M7072 Other bursitis of hip, left hip: Secondary | ICD-10-CM | POA: Diagnosis not present

## 2020-12-17 DIAGNOSIS — M7071 Other bursitis of hip, right hip: Secondary | ICD-10-CM | POA: Diagnosis not present

## 2020-12-17 DIAGNOSIS — M45A6 Non-radiographic axial spondyloarthritis of lumbar region: Secondary | ICD-10-CM | POA: Diagnosis not present

## 2020-12-21 DIAGNOSIS — M7071 Other bursitis of hip, right hip: Secondary | ICD-10-CM | POA: Diagnosis not present

## 2020-12-21 DIAGNOSIS — M45A6 Non-radiographic axial spondyloarthritis of lumbar region: Secondary | ICD-10-CM | POA: Diagnosis not present

## 2020-12-21 DIAGNOSIS — M7072 Other bursitis of hip, left hip: Secondary | ICD-10-CM | POA: Diagnosis not present

## 2020-12-23 DIAGNOSIS — M45A6 Non-radiographic axial spondyloarthritis of lumbar region: Secondary | ICD-10-CM | POA: Diagnosis not present

## 2020-12-23 DIAGNOSIS — M7072 Other bursitis of hip, left hip: Secondary | ICD-10-CM | POA: Diagnosis not present

## 2020-12-23 DIAGNOSIS — M7071 Other bursitis of hip, right hip: Secondary | ICD-10-CM | POA: Diagnosis not present

## 2020-12-28 DIAGNOSIS — M7072 Other bursitis of hip, left hip: Secondary | ICD-10-CM | POA: Diagnosis not present

## 2020-12-28 DIAGNOSIS — M45A6 Non-radiographic axial spondyloarthritis of lumbar region: Secondary | ICD-10-CM | POA: Diagnosis not present

## 2020-12-28 DIAGNOSIS — M7071 Other bursitis of hip, right hip: Secondary | ICD-10-CM | POA: Diagnosis not present

## 2020-12-30 DIAGNOSIS — M7071 Other bursitis of hip, right hip: Secondary | ICD-10-CM | POA: Diagnosis not present

## 2020-12-30 DIAGNOSIS — M7072 Other bursitis of hip, left hip: Secondary | ICD-10-CM | POA: Diagnosis not present

## 2020-12-30 DIAGNOSIS — M45A6 Non-radiographic axial spondyloarthritis of lumbar region: Secondary | ICD-10-CM | POA: Diagnosis not present

## 2021-01-03 DIAGNOSIS — R0789 Other chest pain: Secondary | ICD-10-CM | POA: Diagnosis not present

## 2021-01-03 DIAGNOSIS — R0600 Dyspnea, unspecified: Secondary | ICD-10-CM | POA: Diagnosis not present

## 2021-01-03 DIAGNOSIS — I208 Other forms of angina pectoris: Secondary | ICD-10-CM | POA: Diagnosis not present

## 2021-01-03 DIAGNOSIS — Z8249 Family history of ischemic heart disease and other diseases of the circulatory system: Secondary | ICD-10-CM | POA: Diagnosis not present

## 2021-01-04 DIAGNOSIS — M7071 Other bursitis of hip, right hip: Secondary | ICD-10-CM | POA: Diagnosis not present

## 2021-01-04 DIAGNOSIS — M7072 Other bursitis of hip, left hip: Secondary | ICD-10-CM | POA: Diagnosis not present

## 2021-01-04 DIAGNOSIS — M45A6 Non-radiographic axial spondyloarthritis of lumbar region: Secondary | ICD-10-CM | POA: Diagnosis not present

## 2021-01-10 DIAGNOSIS — I208 Other forms of angina pectoris: Secondary | ICD-10-CM | POA: Diagnosis not present

## 2021-01-10 DIAGNOSIS — Z8249 Family history of ischemic heart disease and other diseases of the circulatory system: Secondary | ICD-10-CM | POA: Diagnosis not present

## 2021-01-10 DIAGNOSIS — R0789 Other chest pain: Secondary | ICD-10-CM | POA: Diagnosis not present

## 2021-01-10 DIAGNOSIS — R0600 Dyspnea, unspecified: Secondary | ICD-10-CM | POA: Diagnosis not present

## 2021-01-10 DIAGNOSIS — I2510 Atherosclerotic heart disease of native coronary artery without angina pectoris: Secondary | ICD-10-CM | POA: Diagnosis not present

## 2021-01-28 DIAGNOSIS — R0609 Other forms of dyspnea: Secondary | ICD-10-CM | POA: Diagnosis not present

## 2021-01-28 DIAGNOSIS — R0600 Dyspnea, unspecified: Secondary | ICD-10-CM | POA: Diagnosis not present

## 2021-03-08 DIAGNOSIS — S233XXA Sprain of ligaments of thoracic spine, initial encounter: Secondary | ICD-10-CM | POA: Diagnosis not present

## 2021-03-08 DIAGNOSIS — S336XXA Sprain of sacroiliac joint, initial encounter: Secondary | ICD-10-CM | POA: Diagnosis not present

## 2021-03-08 DIAGNOSIS — M9903 Segmental and somatic dysfunction of lumbar region: Secondary | ICD-10-CM | POA: Diagnosis not present

## 2021-03-11 DIAGNOSIS — S336XXA Sprain of sacroiliac joint, initial encounter: Secondary | ICD-10-CM | POA: Diagnosis not present

## 2021-03-11 DIAGNOSIS — S233XXA Sprain of ligaments of thoracic spine, initial encounter: Secondary | ICD-10-CM | POA: Diagnosis not present

## 2021-03-11 DIAGNOSIS — M9903 Segmental and somatic dysfunction of lumbar region: Secondary | ICD-10-CM | POA: Diagnosis not present

## 2021-03-15 DIAGNOSIS — M9903 Segmental and somatic dysfunction of lumbar region: Secondary | ICD-10-CM | POA: Diagnosis not present

## 2021-03-15 DIAGNOSIS — Z0000 Encounter for general adult medical examination without abnormal findings: Secondary | ICD-10-CM | POA: Diagnosis not present

## 2021-03-15 DIAGNOSIS — S233XXA Sprain of ligaments of thoracic spine, initial encounter: Secondary | ICD-10-CM | POA: Diagnosis not present

## 2021-03-15 DIAGNOSIS — S336XXA Sprain of sacroiliac joint, initial encounter: Secondary | ICD-10-CM | POA: Diagnosis not present

## 2021-03-18 DIAGNOSIS — S336XXA Sprain of sacroiliac joint, initial encounter: Secondary | ICD-10-CM | POA: Diagnosis not present

## 2021-03-18 DIAGNOSIS — M9903 Segmental and somatic dysfunction of lumbar region: Secondary | ICD-10-CM | POA: Diagnosis not present

## 2021-03-18 DIAGNOSIS — S233XXA Sprain of ligaments of thoracic spine, initial encounter: Secondary | ICD-10-CM | POA: Diagnosis not present

## 2021-03-22 DIAGNOSIS — S233XXA Sprain of ligaments of thoracic spine, initial encounter: Secondary | ICD-10-CM | POA: Diagnosis not present

## 2021-03-22 DIAGNOSIS — M9903 Segmental and somatic dysfunction of lumbar region: Secondary | ICD-10-CM | POA: Diagnosis not present

## 2021-03-22 DIAGNOSIS — S336XXA Sprain of sacroiliac joint, initial encounter: Secondary | ICD-10-CM | POA: Diagnosis not present

## 2021-03-24 DIAGNOSIS — S233XXA Sprain of ligaments of thoracic spine, initial encounter: Secondary | ICD-10-CM | POA: Diagnosis not present

## 2021-03-24 DIAGNOSIS — S336XXA Sprain of sacroiliac joint, initial encounter: Secondary | ICD-10-CM | POA: Diagnosis not present

## 2021-03-24 DIAGNOSIS — M9903 Segmental and somatic dysfunction of lumbar region: Secondary | ICD-10-CM | POA: Diagnosis not present

## 2021-03-28 DIAGNOSIS — M9903 Segmental and somatic dysfunction of lumbar region: Secondary | ICD-10-CM | POA: Diagnosis not present

## 2021-03-28 DIAGNOSIS — S336XXA Sprain of sacroiliac joint, initial encounter: Secondary | ICD-10-CM | POA: Diagnosis not present

## 2021-03-28 DIAGNOSIS — S233XXA Sprain of ligaments of thoracic spine, initial encounter: Secondary | ICD-10-CM | POA: Diagnosis not present

## 2021-04-01 DIAGNOSIS — S336XXA Sprain of sacroiliac joint, initial encounter: Secondary | ICD-10-CM | POA: Diagnosis not present

## 2021-04-01 DIAGNOSIS — M9903 Segmental and somatic dysfunction of lumbar region: Secondary | ICD-10-CM | POA: Diagnosis not present

## 2021-04-01 DIAGNOSIS — S233XXA Sprain of ligaments of thoracic spine, initial encounter: Secondary | ICD-10-CM | POA: Diagnosis not present

## 2021-04-05 DIAGNOSIS — E559 Vitamin D deficiency, unspecified: Secondary | ICD-10-CM | POA: Diagnosis not present

## 2021-04-05 DIAGNOSIS — R102 Pelvic and perineal pain: Secondary | ICD-10-CM | POA: Diagnosis not present

## 2021-04-05 DIAGNOSIS — R1031 Right lower quadrant pain: Secondary | ICD-10-CM | POA: Diagnosis not present

## 2021-04-05 DIAGNOSIS — E785 Hyperlipidemia, unspecified: Secondary | ICD-10-CM | POA: Diagnosis not present

## 2021-04-05 DIAGNOSIS — F330 Major depressive disorder, recurrent, mild: Secondary | ICD-10-CM | POA: Diagnosis not present

## 2021-04-08 DIAGNOSIS — M9903 Segmental and somatic dysfunction of lumbar region: Secondary | ICD-10-CM | POA: Diagnosis not present

## 2021-04-08 DIAGNOSIS — S233XXA Sprain of ligaments of thoracic spine, initial encounter: Secondary | ICD-10-CM | POA: Diagnosis not present

## 2021-04-08 DIAGNOSIS — S336XXA Sprain of sacroiliac joint, initial encounter: Secondary | ICD-10-CM | POA: Diagnosis not present

## 2021-04-14 DIAGNOSIS — S336XXA Sprain of sacroiliac joint, initial encounter: Secondary | ICD-10-CM | POA: Diagnosis not present

## 2021-04-14 DIAGNOSIS — S233XXA Sprain of ligaments of thoracic spine, initial encounter: Secondary | ICD-10-CM | POA: Diagnosis not present

## 2021-04-14 DIAGNOSIS — M9903 Segmental and somatic dysfunction of lumbar region: Secondary | ICD-10-CM | POA: Diagnosis not present

## 2021-04-26 DIAGNOSIS — S336XXA Sprain of sacroiliac joint, initial encounter: Secondary | ICD-10-CM | POA: Diagnosis not present

## 2021-04-26 DIAGNOSIS — S233XXA Sprain of ligaments of thoracic spine, initial encounter: Secondary | ICD-10-CM | POA: Diagnosis not present

## 2021-04-26 DIAGNOSIS — M9903 Segmental and somatic dysfunction of lumbar region: Secondary | ICD-10-CM | POA: Diagnosis not present

## 2021-05-12 DIAGNOSIS — Z7982 Long term (current) use of aspirin: Secondary | ICD-10-CM | POA: Diagnosis not present

## 2021-05-12 DIAGNOSIS — Z9049 Acquired absence of other specified parts of digestive tract: Secondary | ICD-10-CM | POA: Diagnosis not present

## 2021-05-12 DIAGNOSIS — Z882 Allergy status to sulfonamides status: Secondary | ICD-10-CM | POA: Diagnosis not present

## 2021-05-12 DIAGNOSIS — T63461A Toxic effect of venom of wasps, accidental (unintentional), initial encounter: Secondary | ICD-10-CM | POA: Diagnosis not present

## 2021-05-12 DIAGNOSIS — M810 Age-related osteoporosis without current pathological fracture: Secondary | ICD-10-CM | POA: Diagnosis not present

## 2021-05-12 DIAGNOSIS — E785 Hyperlipidemia, unspecified: Secondary | ICD-10-CM | POA: Diagnosis not present

## 2021-05-12 DIAGNOSIS — T63463A Toxic effect of venom of wasps, assault, initial encounter: Secondary | ICD-10-CM | POA: Diagnosis not present

## 2021-05-12 DIAGNOSIS — Z79899 Other long term (current) drug therapy: Secondary | ICD-10-CM | POA: Diagnosis not present

## 2021-05-18 DIAGNOSIS — R102 Pelvic and perineal pain: Secondary | ICD-10-CM | POA: Diagnosis not present

## 2021-05-18 DIAGNOSIS — R1031 Right lower quadrant pain: Secondary | ICD-10-CM | POA: Diagnosis not present

## 2021-05-24 DIAGNOSIS — Z1231 Encounter for screening mammogram for malignant neoplasm of breast: Secondary | ICD-10-CM | POA: Diagnosis not present

## 2021-05-24 DIAGNOSIS — R102 Pelvic and perineal pain: Secondary | ICD-10-CM | POA: Diagnosis not present

## 2021-05-24 DIAGNOSIS — R1031 Right lower quadrant pain: Secondary | ICD-10-CM | POA: Diagnosis not present

## 2021-05-24 DIAGNOSIS — M545 Low back pain, unspecified: Secondary | ICD-10-CM | POA: Diagnosis not present

## 2021-05-24 DIAGNOSIS — R109 Unspecified abdominal pain: Secondary | ICD-10-CM | POA: Diagnosis not present

## 2021-06-10 DIAGNOSIS — K5730 Diverticulosis of large intestine without perforation or abscess without bleeding: Secondary | ICD-10-CM | POA: Diagnosis not present

## 2021-06-10 DIAGNOSIS — K760 Fatty (change of) liver, not elsewhere classified: Secondary | ICD-10-CM | POA: Diagnosis not present

## 2021-06-13 DIAGNOSIS — Z1231 Encounter for screening mammogram for malignant neoplasm of breast: Secondary | ICD-10-CM | POA: Diagnosis not present

## 2021-07-06 DIAGNOSIS — G4711 Idiopathic hypersomnia with long sleep time: Secondary | ICD-10-CM | POA: Diagnosis not present

## 2021-08-25 DIAGNOSIS — Z23 Encounter for immunization: Secondary | ICD-10-CM | POA: Diagnosis not present

## 2021-09-12 DIAGNOSIS — M153 Secondary multiple arthritis: Secondary | ICD-10-CM | POA: Diagnosis not present

## 2021-09-12 DIAGNOSIS — R5383 Other fatigue: Secondary | ICD-10-CM | POA: Diagnosis not present

## 2021-09-12 DIAGNOSIS — B349 Viral infection, unspecified: Secondary | ICD-10-CM | POA: Diagnosis not present

## 2021-09-12 DIAGNOSIS — R52 Pain, unspecified: Secondary | ICD-10-CM | POA: Diagnosis not present

## 2021-12-20 ENCOUNTER — Ambulatory Visit (INDEPENDENT_AMBULATORY_CARE_PROVIDER_SITE_OTHER): Payer: Medicare Other | Admitting: Adult Health

## 2021-12-20 ENCOUNTER — Encounter: Payer: Self-pay | Admitting: Adult Health

## 2021-12-20 ENCOUNTER — Other Ambulatory Visit: Payer: Self-pay

## 2021-12-20 VITALS — BP 131/74 | HR 77 | Ht 66.0 in | Wt 198.0 lb

## 2021-12-20 DIAGNOSIS — L900 Lichen sclerosus et atrophicus: Secondary | ICD-10-CM | POA: Diagnosis not present

## 2021-12-20 DIAGNOSIS — N898 Other specified noninflammatory disorders of vagina: Secondary | ICD-10-CM

## 2021-12-20 MED ORDER — CLOBETASOL PROPIONATE 0.05 % EX OINT: TOPICAL_OINTMENT | CUTANEOUS | 3 refills | Status: DC

## 2021-12-20 NOTE — Progress Notes (Addendum)
°  Subjective:     Patient ID: Kelly Montes, female   DOB: 05-11-1951, 71 y.o.   MRN: [id number]  HPI Kelly Montes is a 71 year old white female, married, PM, and new pt, in complaining of itching in vaginal and rectal area for 3-4 weeks, has tried monistat without relief. Her friend Filbert Berthold told her to come here.  PCP is Dr Manuella Ghazi.   Review of Systems Has itching in vaginal and rectal area for 3-4 weeks She is not sexually active Denies any vaginal bleeding Reviewed past medical,surgical, social and family history. Reviewed medications and allergies.     Objective:   Physical Exam BP 131/74 (BP Location: Left Arm, Patient Position: Sitting, Cuff Size: Normal)    Pulse 77    Ht 5\' 6"  (1.676 m)    Wt 198 lb (89.8 kg)    BMI 31.96 kg/m     Skin warm and dry.Pelvic: external genitalia is normal in appearance, has thin pale skin above clitoral area, vagina: pale and atrophic, has red thin tissue at introitus and has thickened white skin near rectal area,urethra has no lesions or masses noted, cervix:smooth, uterus: normal size, shape and contour, non tender, no masses felt, adnexa: no masses or tenderness noted. Bladder is non tender and no masses felt. AA is 0 Fall risk is low Depression screen PHQ 2/9 12/20/2021  Decreased Interest 0  Down, Depressed, Hopeless 0  PHQ - 2 Score 0  Altered sleeping 0  Tired, decreased energy 0  Change in appetite 0  Feeling bad or failure about yourself  0  Trouble concentrating 0  Moving slowly or fidgety/restless 0  Suicidal thoughts 0  PHQ-9 Score 0    GAD 7 : Generalized Anxiety Score 12/20/2021  Nervous, Anxious, on Edge 0  Control/stop worrying 0  Worry too much - different things 0  Trouble relaxing 0  Restless 0  Easily annoyed or irritable 0  Afraid - awful might happen 0  Total GAD 7 Score 0      Upstream - 12/20/21 1354       Pregnancy Intention Screening   Does the patient want to become pregnant in the next year? N/A    Does  the patient's partner want to become pregnant in the next year? N/A    Would the patient like to discuss contraceptive options today? N/A      Contraception Wrap Up   Current Method No Method - Other Reason   postmenopausal   End Method No Method - Other Reason   postmenopause   Contraception Counseling Provided No            Examination chaperoned by Marcelino Scot RN  Assessment:     1. 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 Itching in the vaginal area Due to LSA will rx temovate     Plan:     Follow up in 4 weeks with me

## 2022-01-17 ENCOUNTER — Ambulatory Visit: Payer: Medicare Other | Admitting: Adult Health

## 2022-07-25 ENCOUNTER — Other Ambulatory Visit: Payer: Self-pay | Admitting: Adult Health Nurse Practitioner

## 2022-07-25 ENCOUNTER — Other Ambulatory Visit (HOSPITAL_COMMUNITY): Payer: Self-pay | Admitting: Adult Health Nurse Practitioner

## 2022-07-25 DIAGNOSIS — R918 Other nonspecific abnormal finding of lung field: Secondary | ICD-10-CM

## 2022-07-25 DIAGNOSIS — R0602 Shortness of breath: Secondary | ICD-10-CM

## 2022-07-26 ENCOUNTER — Ambulatory Visit (HOSPITAL_COMMUNITY)
Admission: RE | Admit: 2022-07-26 | Discharge: 2022-07-26 | Disposition: A | Discharge status: Home / Self Care | Payer: Medicare Other | Source: Ambulatory Visit | Attending: Adult Health Nurse Practitioner | Admitting: Adult Health Nurse Practitioner

## 2022-07-26 DIAGNOSIS — R918 Other nonspecific abnormal finding of lung field: Secondary | ICD-10-CM | POA: Insufficient documentation

## 2022-07-26 DIAGNOSIS — R0602 Shortness of breath: Secondary | ICD-10-CM | POA: Insufficient documentation

## 2022-07-26 MED ORDER — IOHEXOL 300 MG/ML  SOLN
100.0000 mL | Freq: Once | INTRAMUSCULAR | Status: AC | PRN
  Administered 2022-07-26: 75 mL via INTRAVENOUS

## 2023-01-15 ENCOUNTER — Encounter: Payer: Self-pay | Admitting: *Deleted

## 2023-08-30 ENCOUNTER — Other Ambulatory Visit (HOSPITAL_COMMUNITY): Payer: Self-pay

## 2023-08-30 MED ORDER — AMPHETAMINE-DEXTROAMPHET ER 20 MG PO CP24
20.0000 mg | ORAL_CAPSULE | Freq: Every morning | ORAL | 0 refills | Status: AC
  Filled 2023-08-30: qty 30, 30d supply, fill #0

## 2023-08-30 MED ORDER — AMPHETAMINE-DEXTROAMPHETAMINE 20 MG PO TABS
10.0000 mg | ORAL_TABLET | Freq: Every day | ORAL | 0 refills | Status: AC
  Filled 2023-08-30: qty 15, 30d supply, fill #0

## 2023-11-08 NOTE — Progress Notes (Signed)
 Surgery orders requested via Epic inbox with Levester Fresh, PA-C.

## 2023-11-13 NOTE — Progress Notes (Signed)
 Second request for pre op orders in [facility]: Left voicemail for [employer].

## 2023-11-14 NOTE — Patient Instructions (Signed)
 DUE TO COVID-19 ONLY TWO VISITORS  (aged 72 and older)  ARE ALLOWED TO COME WITH YOU AND STAY IN THE WAITING ROOM ONLY DURING PRE OP AND PROCEDURE.   **NO VISITORS ARE ALLOWED IN THE SHORT STAY AREA OR RECOVERY ROOM!!**  IF YOU WILL BE ADMITTED INTO THE HOSPITAL YOU ARE ALLOWED ONLY FOUR SUPPORT PEOPLE DURING VISITATION HOURS ONLY (7 AM -8PM)   The support person(s) must pass our screening, gel in and out, and wear a mask at all times, including in the patient's room. Patients must also wear a mask when staff or their support person are in the room. Visitors GUEST BADGE MUST BE WORN VISIBLY  One adult visitor may remain with you overnight and MUST be in the room by 8 P.M.     Your procedure is scheduled on: 11/27/23   Report to [facility] Main Entrance    Report to admitting at : 10:15 AM   Call this number if you have problems the morning of surgery [phone]   Do not eat food :After Midnight.   After Midnight you may have the following liquids until : 9:45 AM DAY OF SURGERY  Water  Black Coffee (sugar ok, NO MILK/CREAM OR CREAMERS)  Tea (sugar ok, NO MILK/CREAM OR CREAMERS) regular and decaf                             Plain Jell-O (NO RED)                                           Fruit ices (not with fruit pulp, NO RED)                                     Popsicles (NO RED)                                                                  Juice: apple, WHITE grape, WHITE cranberry Sports drinks like Gatorade (NO RED)   The day of surgery:  Drink ONE (1) Pre-Surgery Clear Ensure at : 9:45 AM the morning of surgery. Drink in one sitting. Do not sip.  This drink was given to you during your hospital  pre-op appointment visit. Nothing else to drink after completing the  Pre-Surgery Clear Ensure or G2.          If you have questions, please contact your surgeon's office.  FOLLOW ANY ADDITIONAL PRE OP INSTRUCTIONS YOU RECEIVED FROM YOUR SURGEON'S OFFICE!!!   Oral  Hygiene is also important to reduce your risk of infection.                                    Remember - BRUSH YOUR TEETH THE MORNING OF SURGERY WITH YOUR REGULAR TOOTHPASTE  DENTURES WILL BE REMOVED PRIOR TO SURGERY PLEASE DO NOT APPLY Poly grip OR ADHESIVES!!!   Do NOT smoke after Midnight   Take these medicines the morning of surgery  with A SIP OF WATER : escitalopram .                              You may not have any metal on your body including hair pins, jewelry, and body piercing             Do not wear make-up, lotions, powders, perfumes/cologne, or deodorant  Do not wear nail polish including gel and S&S, artificial/acrylic nails, or any other type of covering on natural nails including finger and toenails. If you have artificial nails, gel coating, etc. that needs to be removed by a nail salon please have this removed prior to surgery or surgery may need to be canceled/ delayed if the surgeon/ anesthesia feels like they are unable to be safely monitored.   Do not shave  48 hours prior to surgery.    Do not bring valuables to the hospital. [facility] IS NOT             RESPONSIBLE   FOR VALUABLES.   Contacts, glasses, or bridgework may not be worn into surgery.   Bring small overnight bag day of surgery.   DO NOT BRING YOUR HOME MEDICATIONS TO THE HOSPITAL. PHARMACY WILL DISPENSE MEDICATIONS LISTED ON YOUR MEDICATION LIST TO YOU DURING YOUR ADMISSION IN THE HOSPITAL!    Patients discharged on the day of surgery will not be allowed to drive home.  Someone NEEDS to stay with you for the first 24 hours after anesthesia.   Special Instructions: Bring a copy of your healthcare power of attorney and living will documents         the day of surgery if you haven't scanned them before.              Please read over the following fact sheets you were given: IF YOU HAVE QUESTIONS ABOUT YOUR PRE-OP INSTRUCTIONS PLEASE CALL [phone]      Pre-operative 5 CHG Bath Instructions    You can play a key role in reducing the risk of infection after surgery. Your skin needs to be as free of germs as possible. You can reduce the number of germs on your skin by washing with CHG (chlorhexidine  gluconate) soap before surgery. CHG is an antiseptic soap that kills germs and continues to kill germs even after washing.   DO NOT use if you have an allergy to chlorhexidine /CHG or antibacterial soaps. If your skin becomes reddened or irritated, stop using the CHG and notify one of our RNs at : [phone].   Please shower with the CHG soap starting 4 days before surgery using the following schedule:     Please keep in mind the following:  DO NOT shave, including legs and underarms, starting the day of your first shower.   You may shave your face at any point before/day of surgery.  Place clean sheets on your bed the day you start using CHG soap. Use a clean washcloth (not used since being washed) for each shower. DO NOT sleep with pets once you start using the CHG.   CHG Shower Instructions:  If you choose to wash your hair and private area, wash first with your normal shampoo/soap.  After you use shampoo/soap, rinse your hair and body thoroughly to remove shampoo/soap residue.  Turn the water  OFF and apply about 3 tablespoons (45 ml) of CHG soap to a CLEAN washcloth.  Apply CHG soap ONLY FROM YOUR NECK DOWN  TO YOUR TOES (washing for 3-5 minutes)  DO NOT use CHG soap on face, private areas, open wounds, or sores.  Pay special attention to the area where your surgery is being performed.  If you are having back surgery, having someone wash your back for you may be helpful. Wait 2 minutes after CHG soap is applied, then you may rinse off the CHG soap.  Pat dry with a clean towel  Put on clean clothes/pajamas   If you choose to wear lotion, please use ONLY the CHG-compatible lotions on the back of this paper.     Additional instructions for the day of surgery: DO NOT APPLY any  lotions, deodorants, cologne, or perfumes.   Put on clean/comfortable clothes.  Brush your teeth.  Ask your nurse before applying any prescription medications to the skin.   CHG Compatible Lotions   Aveeno Moisturizing lotion  Cetaphil Moisturizing Cream  Cetaphil Moisturizing Lotion  Clairol Herbal Essence Moisturizing Lotion, Dry Skin  Clairol Herbal Essence Moisturizing Lotion, Extra Dry Skin  Clairol Herbal Essence Moisturizing Lotion, Normal Skin  Curel Age Defying Therapeutic Moisturizing Lotion with Alpha Hydroxy  Curel Extreme Care Body Lotion  Curel Soothing Hands Moisturizing Hand Lotion  Curel Therapeutic Moisturizing Cream, Fragrance-Free  Curel Therapeutic Moisturizing Lotion, Fragrance-Free  Curel Therapeutic Moisturizing Lotion, Original Formula  Eucerin Daily Replenishing Lotion  Eucerin Dry Skin Therapy Plus Alpha Hydroxy Crme  Eucerin Dry Skin Therapy Plus Alpha Hydroxy Lotion  Eucerin Original Crme  Eucerin Original Lotion  Eucerin Plus Crme Eucerin Plus Lotion  Eucerin TriLipid Replenishing Lotion  Keri Anti-Bacterial Hand Lotion  Keri Deep Conditioning Original Lotion Dry Skin Formula Softly Scented  Keri Deep Conditioning Original Lotion, Fragrance Free Sensitive Skin Formula  Keri Lotion Fast Absorbing Fragrance Free Sensitive Skin Formula  Keri Lotion Fast Absorbing Softly Scented Dry Skin Formula  Keri Original Lotion  Keri Skin Renewal Lotion Keri Silky Smooth Lotion  Keri Silky Smooth Sensitive Skin Lotion  Nivea Body Creamy Conditioning Oil  Nivea Body Extra Enriched Lotion  Nivea Body Original Lotion  Nivea Body Sheer Moisturizing Lotion Nivea Crme  Nivea Skin Firming Lotion  NutraDerm 30 Skin Lotion  NutraDerm Skin Lotion  NutraDerm Therapeutic Skin Cream  NutraDerm Therapeutic Skin Lotion  ProShield Protective Hand Cream  Provon moisturizing lotion   Incentive Spirometer  An incentive spirometer is a tool that can help keep your  lungs clear and active. This tool measures how well you are filling your lungs with each breath. Taking long deep breaths may help reverse or decrease the chance of developing breathing (pulmonary) problems (especially infection) following: A long period of time when you are unable to move or be active. BEFORE THE PROCEDURE  If the spirometer includes an indicator to show your best effort, your nurse or respiratory therapist will set it to a desired goal. If possible, sit up straight or lean slightly forward. Try not to slouch. Hold the incentive spirometer in an upright position. INSTRUCTIONS FOR USE  Sit on the edge of your bed if possible, or sit up as far as you can in bed or on a chair. Hold the incentive spirometer in an upright position. Breathe out normally. Place the mouthpiece in your mouth and seal your lips tightly around it. Breathe in slowly and as deeply as possible, raising the piston or the ball toward the top of the column. Hold your breath for 3-5 seconds or for as long as possible. Allow the piston or ball to fall  to the bottom of the column. Remove the mouthpiece from your mouth and breathe out normally. Rest for a few seconds and repeat Steps 1 through 7 at least 10 times every 1-2 hours when you are awake. Take your time and take a few normal breaths between deep breaths. The spirometer may include an indicator to show your best effort. Use the indicator as a goal to work toward during each repetition. After each set of 10 deep breaths, practice coughing to be sure your lungs are clear. If you have an incision (the cut made at the time of surgery), support your incision when coughing by placing a pillow or rolled up towels firmly against it. Once you are able to get out of bed, walk around indoors and cough well. You may stop using the incentive spirometer when instructed by your caregiver.  RISKS AND COMPLICATIONS Take your time so you do not get dizzy or light-headed. If  you are in pain, you may need to take or ask for pain medication before doing incentive spirometry. It is harder to take a deep breath if you are having pain. AFTER USE Rest and breathe slowly and easily. It can be helpful to keep track of a log of your progress. Your caregiver can provide you with a simple table to help with this. If you are using the spirometer at home, follow these instructions: SEEK MEDICAL CARE IF:  You are having difficultly using the spirometer. You have trouble using the spirometer as often as instructed. Your pain medication is not giving enough relief while using the spirometer. You develop fever of 100.5 F (38.1 C) or higher. SEEK IMMEDIATE MEDICAL CARE IF:  You cough up bloody sputum that had not been present before. You develop fever of 102 F (38.9 C) or greater. You develop worsening pain at or near the incision site. MAKE SURE YOU:  Understand these instructions. Will watch your condition. Will get help right away if you are not doing well or get worse. Document Released: 03/05/2007 Document Revised: 01/15/2012 Document Reviewed: 05/06/2007 [facility] Patient Information 2014 [place], [place].   ________________________________________________________________________

## 2023-11-15 ENCOUNTER — Encounter (HOSPITAL_COMMUNITY)
Admission: RE | Admit: 2023-11-15 | Discharge: 2023-11-15 | Disposition: A | Discharge status: Home / Self Care | Payer: PPO | Source: Ambulatory Visit | Attending: Orthopedic Surgery | Admitting: Orthopedic Surgery

## 2023-11-15 ENCOUNTER — Encounter (HOSPITAL_COMMUNITY): Payer: Self-pay

## 2023-11-15 ENCOUNTER — Other Ambulatory Visit: Payer: Self-pay

## 2023-11-15 VITALS — BP 133/79 | HR 99 | Temp 98.7°F | Ht 66.0 in | Wt 188.0 lb

## 2023-11-15 DIAGNOSIS — Z01812 Encounter for preprocedural laboratory examination: Secondary | ICD-10-CM | POA: Diagnosis present

## 2023-11-15 DIAGNOSIS — Z01818 Encounter for other preprocedural examination: Secondary | ICD-10-CM

## 2023-11-15 HISTORY — DX: Pneumonia, unspecified organism: J18.9

## 2023-11-15 HISTORY — DX: Personal history of urinary calculi: Z87.442

## 2023-11-15 LAB — CBC
HCT: 45.7 % (ref 36.0–46.0)
Hemoglobin: 14.6 g/dL (ref 12.0–15.0)
MCH: 29.4 pg (ref 26.0–34.0)
MCHC: 31.9 g/dL (ref 30.0–36.0)
MCV: 92.1 fL (ref 80.0–100.0)
Platelets: 258 10*3/uL (ref 150–400)
RBC: 4.96 MIL/uL (ref 3.87–5.11)
RDW: 13.4 % (ref 11.5–15.5)
WBC: 6.2 10*3/uL (ref 4.0–10.5)
nRBC: 0 % (ref 0.0–0.2)

## 2023-11-15 LAB — SURGICAL PCR SCREEN
MRSA, PCR: NEGATIVE
Staphylococcus aureus: NEGATIVE

## 2023-11-15 NOTE — Progress Notes (Signed)
 For Anesthesia: PCP - Teresa Aldona CROME, NP  Cardiologist - N/A  Bowel Prep reminder:  Chest x-ray -  EKG - 03/07/23: CEW Stress Test -  ECHO -  Cardiac Cath -  Pacemaker/ICD device last checked: Pacemaker orders received: Device Rep notified:  Spinal Cord Stimulator:N/A  Sleep Study - Yes CPAP - NO  Fasting Blood Sugar - N/A Checks Blood Sugar _____ times a day Date and result of last Hgb A1c-  Last dose of GLP1 agonist- N/A GLP1 instructions:   Last dose of SGLT-2 inhibitors- N/A SGLT-2 instructions:   Blood Thinner Instructions:N/A Aspirin  Instructions: Last Dose:  Activity level: Can go up a flight of stairs and activities of daily living without stopping and without chest pain and/or shortness of breath   Able to exercise without chest pain and/or shortness of breath  Anesthesia review:   Patient denies shortness of breath, fever, cough and chest pain at PAT appointment   Patient verbalized understanding of instructions that were given to them at the PAT appointment. Patient was also instructed that they will need to review over the PAT instructions again at home before surgery.

## 2023-11-19 NOTE — H&P (Signed)
 KNEE ARTHROPLASTY ADMISSION H&P  Patient ID: Kelly Montes MRN: [id number] DOB/AGE: [birth year] 73 y.o.  Chief Complaint: left knee pain.  Planned Procedure Date: 11/27/23 Medical and Cardiac Clearance by Aldona Pizza NP     HPI: Kelly Montes is a 73 y.o. female who presents for evaluation of OA LEFT KNEE. The patient has a history of pain and functional disability in the left knee due to arthritis and has failed non-surgical conservative treatments for greater than 12 weeks to include NSAID's and/or analgesics, corticosteriod injections, viscosupplementation injections, use of assistive devices, and activity modification.  Onset of symptoms was gradual, starting 4 months ago with rapidlly worsening course since that time. The patient noted no past surgery on the left knee.  Patient currently rates pain at 9 out of 10 with activity. Patient has night pain, worsening of pain with activity and weight bearing, and pain that interferes with activities of daily living.  Patient has evidence of subchondral sclerosis, periarticular osteophytes, and joint space narrowing by imaging studies.  There is no active infection.  Past Medical History:  Diagnosis Date   Depression    GERD (gastroesophageal reflux disease)    History of hiatal hernia    History of kidney stones    Macular hole    Pneumonia    Primary localized osteoarthritis of right knee    Past Surgical History:  Procedure Laterality Date   CESAREAN SECTION     CHOLECYSTECTOMY     COLONOSCOPY     COLONOSCOPY N/A 02/23/2016   Procedure: COLONOSCOPY;  Surgeon: Lamar CHRISTELLA Hollingshead, MD;  Location[facility];  Service: Endoscopy;  Laterality: N/A;  9:30 AM   EYE SURGERY     CATARACT REMOVAL   GAS/FLUID EXCHANGE Right 03/01/2015   Procedure: GAS/FLUID EXCHANGE;  Surgeon: Selinda Slocumb, MD;  Location: [facility] OR;  Service: Ophthalmology;  Laterality: Right;   GAS/FLUID EXCHANGE Left 04/26/2015   Procedure: GAS/FLUID EXCHANGE;   Surgeon: Selinda Slocumb, MD;  Location: [facility] OR;  Service: Ophthalmology;  Laterality: Left;   LAPAROTOMY     MEMBRANE PEEL Left 04/26/2015   Procedure: MEMBRANE PEEL;  Surgeon: Selinda Slocumb, MD;  Location: [facility] OR;  Service: Ophthalmology;  Laterality: Left;   PARS PLANA VITRECTOMY Right 03/01/2015   Procedure: PARS PLANA VITRECTOMY WITH 25 GAUGE WITH MEMBRANE PEEL;  Surgeon: Selinda Slocumb, MD;  Location: [facility] OR;  Service: Ophthalmology;  Laterality: Right;   PARS PLANA VITRECTOMY Left 04/26/2015   Procedure: PARS PLANA VITRECTOMY WITH 25 GAUGE;  Surgeon: Selinda Slocumb, MD;  Location: [facility] OR;  Service: Ophthalmology;  Laterality: Left;   PHOTOCOAGULATION WITH LASER Left 04/26/2015   Procedure: PHOTOCOAGULATION WITH LASER;  Surgeon: Selinda Slocumb, MD;  Location: [facility] OR;  Service: Ophthalmology;  Laterality: Left;   PLACEMENT OF BREAST IMPLANTS     times two   TOTAL KNEE ARTHROPLASTY Right 11/22/2015   Procedure: TOTAL KNEE ARTHROPLASTY;  Surgeon: Lamar Millman, MD;  Location: [facility] OR;  Service: Orthopedics;  Laterality: Right;   WRIST SURGERY Left    Allergies  Allergen Reactions   Metronidazole Hives and Itching   Ibandronic Acid Nausea And Vomiting    Gastritis    Poison Oak Extract Rash   Venlafaxine Swelling and Other (See Comments)    Mouth ulcers     Sulfonamide Derivatives Nausea And Vomiting   Prior to Admission medications   Medication Sig Start Date End Date Taking? Authorizing Provider  acetaminophen  (TYLENOL ) 500 MG tablet Take 1,000 mg by mouth every 6 (six)  hours as needed for moderate pain (pain score 4-6).   Yes [provider]  amphetamine -dextroamphetamine  (ADDERALL XR) 20 MG 24 hr capsule Take 1 capsule (20 mg total) by mouth in the morning. 08/30/23  Yes   amphetamine -dextroamphetamine  (ADDERALL) 20 MG tablet Take 10 mg by mouth daily in the afternoon.   Yes [provider]  escitalopram  (LEXAPRO ) 20 MG tablet Take 20 mg by mouth daily. 12/08/21  Yes  [provider]  lansoprazole (PREVACID) 15 MG capsule Take 30 mg by mouth at bedtime.   Yes [provider]  meloxicam (MOBIC) 15 MG tablet Take 15 mg by mouth daily.   Yes [provider]  METAMUCIL FIBER PO Take 1 Scoop by mouth daily.   Yes [provider]  amphetamine -dextroamphetamine  (ADDERALL) 20 MG tablet Take 0.5 tablets (10 mg total) by mouth daily. Patient not taking: Reported on 11/12/2023 08/30/23      Social History   Socioeconomic History   Marital status: Married    Spouse name: Not on file   Number of children: Not on file   Years of education: Not on file   Highest education level: Not on file  Occupational History   Not on file  Tobacco Use   Smoking status: Never   Smokeless tobacco: Not on file  Vaping Use   Vaping status: Never Used  Substance and Sexual Activity   Alcohol  use: No   Drug use: No   Sexual activity: Not Currently    Birth control/protection: Post-menopausal  Other Topics Concern   Not on file  Social History Narrative   Not on file   Social Drivers of Health   Financial Resource Strain: Low Risk  (04/05/2023)   Received from [facility]   Overall Financial Resource Strain (CARDIA)    Difficulty of Paying Living Expenses: Not hard at all  Food Insecurity: No Food Insecurity (04/05/2023)   [facility]   Hunger Vital Sign    Worried About Running Out of Food in the Last Year: Never true    Ran Out of Food in the Last Year: Never true  Transportation Needs: No Transportation Needs (04/05/2023)   Received from [facility] - Transportation    Lack of Transportation (Medical): No    Lack of Transportation (Non-Medical): No  Physical Activity: Insufficiently Active (04/05/2023)   Received from [facility]   Exercise Vital Sign    Days of Exercise per Week: 3 days    Minutes of Exercise per Session: 30 min  Stress: No Stress Concern Present (04/05/2023)   Received from  [facility] - Occupational Stress Questionnaire    Feeling of Stress : Not at all  Social Connections: Moderately Integrated (04/05/2023)   Received from [facility]   Social Network    How would you rate your social network (family, work, friends)?: Adequate participation with social networks   Family History  Problem Relation Age of Onset   Coronary artery disease Father    Arthritis Father    Arthritis Brother    Arthritis Sister     ROS: Currently denies lightheadedness, dizziness, Fever, chills, CP, SOB.   No personal history of DVT, PE, MI, or CVA. No loose teeth or dentures All other systems have been reviewed and were otherwise currently negative with the exception of those mentioned in the HPI and as above.  Objective: Vitals: Ht: 5'6 Wt: 193.2 lbs Temp: 98.2 BP: 142/82  Pulse: 76 O2 96% on room air.   Physical Exam: General: Alert, NAD.  Antalgic Gait  HEENT: EOMI, Good Neck Extension  Pulm: No increased work of breathing.  Clear B/L A/P w/o crackle or wheeze.  CV: RRR, No m/g/r appreciated  GI: soft, NT, ND. BS x 4 quadrants Neuro: CN II-XII grossly intact without focal deficit.  Sensation intact distally Skin: No lesions in the area of chief complaint MSK/Surgical Site:  + JLT. ROM slow and limited to 10-105 degrees.  Decreased strength in extension and flexion.  +EHL/FHL.  NVI.  Pain and instability with varus and valgus stress.    Imaging Review Plain radiographs demonstrate moderate degenerative joint disease of the left knee.   The overall alignment ismild varus. The bone quality appears to be fair for age and reported activity level.  Preoperative templating of the joint replacement has been completed, documented, and submitted to the Operating Room personnel in order to optimize intra-operative equipment management.  Assessment: OA LEFT KNEE Active Problems:   * No active hospital problems. *   Plan: Plan  for Procedure(s): TOTAL KNEE ARTHROPLASTY  The patient history, physical exam, clinical judgement of the provider and imaging are consistent with end stage degenerative joint disease and total joint arthroplasty is deemed medically necessary. The treatment options including medical management, injection therapy, and arthroplasty were discussed at length. The risks and benefits of Procedure(s): TOTAL KNEE ARTHROPLASTY were presented and reviewed.  The risks of nonoperative treatment, versus surgical intervention including but not limited to continued pain, aseptic loosening, stiffness, dislocation/subluxation, infection, bleeding, nerve injury, blood clots, cardiopulmonary complications, morbidity, mortality, among others were discussed. The patient verbalizes understanding and wishes to proceed with the plan.  Patient is being admitted for inpatient treatment for surgery, pain control, PT, prophylactic antibiotics, VTE prophylaxis, progressive ambulation, ADL's and discharge planning.   Dental prophylaxis discussed and recommended for 2 years postoperatively.  The patient does meet the criteria for TXA which will be used perioperatively.   ASA 81 mg BID will be used postoperatively for DVT prophylaxis in addition to SCDs, and early ambulation. Plan for Tylenol , Mobic, oxycodone  for pain.   Robaxin  for muscle spasms.   Zofran  for nausea and vomiting. Miralax  is for constipation prevention which she already had enough of at home Pharmacy- [facility] The patient is planning to be discharged home with OPPT and into the care of her daughter Corean Lunger who can be reached at [phone] Follow up appt 12/12/23 at [facility] CHRISTELLA Ted RIGGERS Office [phone] 11/19/2023 12:23 PM

## 2023-11-26 NOTE — Anesthesia Preprocedure Evaluation (Signed)
Anesthesia Evaluation  Patient identified by MRN, date of birth, ID band Patient awake    Reviewed: Allergy & Precautions, NPO status , Patient's Chart, lab work & pertinent test results  Airway Mallampati: II  TM Distance: >3 FB Neck ROM: Full    Dental no notable dental hx. (+) Implants   Pulmonary    Pulmonary exam normal breath sounds clear to auscultation       Cardiovascular negative cardio ROS Normal cardiovascular exam Rhythm:Regular Rate:Normal     Neuro/Psych    Depression    negative neurological ROS     GI/Hepatic Neg liver ROS, hiatal hernia,GERD  ,,  Endo/Other  negative endocrine ROS    Renal/GU      Musculoskeletal  (+) Arthritis , Osteoarthritis,    Abdominal   Peds  Hematology Lab Results      Component                Value               Date                      WBC                      6.2                 11/15/2023                HGB                      14.6                11/15/2023                HCT                      45.7                11/15/2023                MCV                      92.1                11/15/2023                PLT                      258                 11/15/2023              Anesthesia Other Findings All: metronidazole, venlafaxine, sulfa  Reproductive/Obstetrics                              Anesthesia Physical Anesthesia Plan  ASA: 3  Anesthesia Plan: Spinal and Regional   Post-op Pain Management: Regional block* and Minimal or no pain anticipated   Induction:   PONV Risk Score and Plan: Treatment may vary due to age or medical condition, Propofol infusion and Ondansetron  Airway Management Planned: Natural Airway and Simple Face Mask  Additional Equipment: None  Intra-op Plan:   Post-operative Plan:   Informed Consent: I have reviewed the patients History and Physical, chart, labs and discussed the procedure including  the risks,  benefits and alternatives for the proposed anesthesia with the patient or authorized representative who has indicated his/her understanding and acceptance.     Dental advisory given  Plan Discussed with: CRNA and Anesthesiologist  Anesthesia Plan Comments: (SP w L adductor)         Anesthesia Quick Evaluation

## 2023-11-27 ENCOUNTER — Ambulatory Visit (HOSPITAL_COMMUNITY): Payer: PPO

## 2023-11-27 ENCOUNTER — Encounter (HOSPITAL_COMMUNITY)
Admission: RE | Disposition: A | Discharge status: Home / Self Care | Payer: Self-pay | Source: Ambulatory Visit | Attending: Orthopedic Surgery

## 2023-11-27 ENCOUNTER — Ambulatory Visit (HOSPITAL_COMMUNITY): Payer: Self-pay | Admitting: Anesthesiology

## 2023-11-27 ENCOUNTER — Ambulatory Visit (HOSPITAL_BASED_OUTPATIENT_CLINIC_OR_DEPARTMENT_OTHER): Payer: PPO | Admitting: Anesthesiology

## 2023-11-27 ENCOUNTER — Other Ambulatory Visit: Payer: Self-pay

## 2023-11-27 ENCOUNTER — Encounter (HOSPITAL_COMMUNITY): Payer: Self-pay | Admitting: Orthopedic Surgery

## 2023-11-27 ENCOUNTER — Observation Stay (HOSPITAL_COMMUNITY)
Admission: RE | Admit: 2023-11-27 | Discharge: 2023-11-28 | Disposition: A | Discharge status: Home / Self Care | Payer: PPO | Source: Ambulatory Visit | Attending: Orthopedic Surgery | Admitting: Orthopedic Surgery

## 2023-11-27 DIAGNOSIS — F32A Depression, unspecified: Secondary | ICD-10-CM | POA: Diagnosis not present

## 2023-11-27 DIAGNOSIS — M1712 Unilateral primary osteoarthritis, left knee: Secondary | ICD-10-CM | POA: Diagnosis present

## 2023-11-27 DIAGNOSIS — Z96652 Presence of left artificial knee joint: Principal | ICD-10-CM

## 2023-11-27 DIAGNOSIS — Z79899 Other long term (current) drug therapy: Secondary | ICD-10-CM | POA: Diagnosis not present

## 2023-11-27 DIAGNOSIS — Z96651 Presence of right artificial knee joint: Secondary | ICD-10-CM | POA: Diagnosis not present

## 2023-11-27 HISTORY — PX: TOTAL KNEE ARTHROPLASTY: SHX125

## 2023-11-27 SURGERY — ARTHROPLASTY, KNEE, TOTAL
Anesthesia: Regional | Site: Knee | Laterality: Left

## 2023-11-27 MED ORDER — LACTATED RINGERS IV SOLN: INTRAVENOUS | Status: DC

## 2023-11-27 MED ORDER — LACTATED RINGERS IV BOLUS: 500.0000 mL | Freq: Once | INTRAVENOUS | Status: DC

## 2023-11-27 MED ORDER — METOCLOPRAMIDE HCL 5 MG/ML IJ SOLN: 5.0000 mg | Freq: Three times a day (TID) | INTRAMUSCULAR | Status: DC | PRN

## 2023-11-27 MED ORDER — FENTANYL CITRATE PF 50 MCG/ML IJ SOSY
50.0000 ug | PREFILLED_SYRINGE | INTRAMUSCULAR | Status: AC
  Administered 2023-11-27: 50 ug via INTRAVENOUS
  Filled 2023-11-27: qty 2

## 2023-11-27 MED ORDER — HYDROMORPHONE HCL 1 MG/ML IJ SOLN: 0.5000 mg | INTRAMUSCULAR | Status: DC | PRN

## 2023-11-27 MED ORDER — ONDANSETRON 4 MG PO TBDP: 4.0000 mg | ORAL_TABLET | Freq: Three times a day (TID) | ORAL | 0 refills | Status: AC | PRN

## 2023-11-27 MED ORDER — ACETAMINOPHEN 325 MG PO TABS: 325.0000 mg | ORAL_TABLET | Freq: Four times a day (QID) | ORAL | Status: DC | PRN

## 2023-11-27 MED ORDER — ASPIRIN 81 MG PO CHEW
81.0000 mg | CHEWABLE_TABLET | Freq: Two times a day (BID) | ORAL | Status: DC
  Administered 2023-11-27 – 2023-11-28 (×2): 81 mg via ORAL
  Filled 2023-11-27 (×2): qty 1

## 2023-11-27 MED ORDER — ACETAMINOPHEN 10 MG/ML IV SOLN: 1000.0000 mg | Freq: Once | INTRAVENOUS | Status: DC | PRN

## 2023-11-27 MED ORDER — ROPIVACAINE HCL 5 MG/ML IJ SOLN
INTRAMUSCULAR | Status: DC | PRN
  Administered 2023-11-27: 30 mL via PERINEURAL

## 2023-11-27 MED ORDER — AMPHETAMINE-DEXTROAMPHETAMINE 10 MG PO TABS
10.0000 mg | ORAL_TABLET | Freq: Every day | ORAL | Status: DC
Start: 2023-11-28 — End: 2023-11-28
  Administered 2023-11-28: 10 mg via ORAL
  Filled 2023-11-27: qty 1

## 2023-11-27 MED ORDER — POLYETHYLENE GLYCOL 3350 17 G PO PACK: 17.0000 g | PACK | Freq: Every day | ORAL | Status: DC | PRN

## 2023-11-27 MED ORDER — MENTHOL 3 MG MT LOZG
1.0000 | LOZENGE | OROMUCOSAL | Status: DC | PRN
Start: 2023-11-27 — End: 2023-11-28

## 2023-11-27 MED ORDER — DEXAMETHASONE SODIUM PHOSPHATE 10 MG/ML IJ SOLN
8.0000 mg | Freq: Once | INTRAMUSCULAR | Status: AC
  Administered 2023-11-27: 8 mg via INTRAVENOUS

## 2023-11-27 MED ORDER — TRANEXAMIC ACID-NACL 1000-0.7 MG/100ML-% IV SOLN
INTRAVENOUS | Status: AC
  Administered 2023-11-27: 1000 mg via INTRAVENOUS
  Filled 2023-11-27: qty 100

## 2023-11-27 MED ORDER — CEFAZOLIN SODIUM-DEXTROSE 2-4 GM/100ML-% IV SOLN
2.0000 g | INTRAVENOUS | Status: AC
  Administered 2023-11-27: 2 g via INTRAVENOUS
  Filled 2023-11-27: qty 100

## 2023-11-27 MED ORDER — OXYCODONE HCL 5 MG PO TABS
ORAL_TABLET | ORAL | Status: AC
  Administered 2023-11-27: 10 mg via ORAL
  Filled 2023-11-27: qty 2

## 2023-11-27 MED ORDER — PROPOFOL 500 MG/50ML IV EMUL
INTRAVENOUS | Status: AC
  Filled 2023-11-27: qty 50

## 2023-11-27 MED ORDER — ONDANSETRON HCL 4 MG PO TABS
4.0000 mg | ORAL_TABLET | Freq: Four times a day (QID) | ORAL | Status: DC | PRN
Start: 2023-11-27 — End: 2023-11-28

## 2023-11-27 MED ORDER — EPHEDRINE SULFATE-NACL 50-0.9 MG/10ML-% IV SOSY
PREFILLED_SYRINGE | INTRAVENOUS | Status: DC | PRN
  Administered 2023-11-27 (×2): 5 mg via INTRAVENOUS

## 2023-11-27 MED ORDER — ASPIRIN 81 MG PO TBEC: 81.0000 mg | DELAYED_RELEASE_TABLET | Freq: Two times a day (BID) | ORAL | 0 refills | Status: AC

## 2023-11-27 MED ORDER — DEXAMETHASONE SODIUM PHOSPHATE 10 MG/ML IJ SOLN
10.0000 mg | Freq: Once | INTRAMUSCULAR | Status: AC
  Administered 2023-11-28: 10 mg via INTRAVENOUS
  Filled 2023-11-27: qty 1

## 2023-11-27 MED ORDER — LACTATED RINGERS IV BOLUS
250.0000 mL | Freq: Once | INTRAVENOUS | Status: AC
  Administered 2023-11-28: 250 mL via INTRAVENOUS

## 2023-11-27 MED ORDER — 0.9 % SODIUM CHLORIDE (POUR BTL) OPTIME
TOPICAL | Status: DC | PRN
  Administered 2023-11-27: 1000 mL

## 2023-11-27 MED ORDER — ONDANSETRON HCL 4 MG/2ML IJ SOLN
INTRAMUSCULAR | Status: AC
  Filled 2023-11-27: qty 2

## 2023-11-27 MED ORDER — BISACODYL 10 MG RE SUPP: 10.0000 mg | Freq: Every day | RECTAL | Status: DC | PRN

## 2023-11-27 MED ORDER — DEXAMETHASONE SODIUM PHOSPHATE 10 MG/ML IJ SOLN
INTRAMUSCULAR | Status: AC
  Filled 2023-11-27: qty 1

## 2023-11-27 MED ORDER — FENTANYL CITRATE PF 50 MCG/ML IJ SOSY
25.0000 ug | PREFILLED_SYRINGE | INTRAMUSCULAR | Status: DC | PRN
Start: 2023-11-27 — End: 2023-11-27

## 2023-11-27 MED ORDER — TRANEXAMIC ACID-NACL 1000-0.7 MG/100ML-% IV SOLN
1000.0000 mg | INTRAVENOUS | Status: AC
  Administered 2023-11-27: 1000 mg via INTRAVENOUS
  Filled 2023-11-27: qty 100

## 2023-11-27 MED ORDER — METOCLOPRAMIDE HCL 5 MG PO TABS
5.0000 mg | ORAL_TABLET | Freq: Three times a day (TID) | ORAL | Status: DC | PRN
Start: 2023-11-27 — End: 2023-11-28

## 2023-11-27 MED ORDER — BUPIVACAINE LIPOSOME 1.3 % IJ SUSP
INTRAMUSCULAR | Status: AC
  Filled 2023-11-27: qty 20

## 2023-11-27 MED ORDER — ORAL CARE MOUTH RINSE: 15.0000 mL | OROMUCOSAL | Status: DC | PRN

## 2023-11-27 MED ORDER — BUPIVACAINE-EPINEPHRINE 0.25% -1:200000 IJ SOLN
INTRAMUSCULAR | Status: AC
  Filled 2023-11-27: qty 1

## 2023-11-27 MED ORDER — CHLORHEXIDINE GLUCONATE 0.12 % MT SOLN
15.0000 mL | Freq: Once | OROMUCOSAL | Status: AC
  Administered 2023-11-27: 15 mL via OROMUCOSAL

## 2023-11-27 MED ORDER — ACETAMINOPHEN 500 MG PO TABS
1000.0000 mg | ORAL_TABLET | Freq: Once | ORAL | Status: AC
  Administered 2023-11-27: 1000 mg via ORAL
  Filled 2023-11-27: qty 2

## 2023-11-27 MED ORDER — DOCUSATE SODIUM 100 MG PO CAPS
100.0000 mg | ORAL_CAPSULE | Freq: Two times a day (BID) | ORAL | Status: DC
  Administered 2023-11-27 – 2023-11-28 (×2): 100 mg via ORAL
  Filled 2023-11-27 (×2): qty 1

## 2023-11-27 MED ORDER — OXYCODONE HCL 5 MG PO TABS: 10.0000 mg | ORAL_TABLET | ORAL | Status: DC | PRN

## 2023-11-27 MED ORDER — METHOCARBAMOL 1000 MG/10ML IJ SOLN: 500.0000 mg | Freq: Four times a day (QID) | INTRAMUSCULAR | Status: DC | PRN

## 2023-11-27 MED ORDER — OXYCODONE HCL 5 MG PO TABS
5.0000 mg | ORAL_TABLET | ORAL | Status: DC | PRN
Start: 2023-11-27 — End: 2023-11-28

## 2023-11-27 MED ORDER — ONDANSETRON HCL 4 MG/2ML IJ SOLN: 4.0000 mg | Freq: Four times a day (QID) | INTRAMUSCULAR | Status: DC | PRN

## 2023-11-27 MED ORDER — EPHEDRINE 5 MG/ML INJ
INTRAVENOUS | Status: AC
  Filled 2023-11-27: qty 5

## 2023-11-27 MED ORDER — ESCITALOPRAM OXALATE 20 MG PO TABS
20.0000 mg | ORAL_TABLET | Freq: Every day | ORAL | Status: DC
  Administered 2023-11-28: 20 mg via ORAL
  Filled 2023-11-27: qty 1

## 2023-11-27 MED ORDER — DIPHENHYDRAMINE HCL 12.5 MG/5ML PO ELIX: 12.5000 mg | ORAL_SOLUTION | ORAL | Status: DC | PRN

## 2023-11-27 MED ORDER — BUPIVACAINE LIPOSOME 1.3 % IJ SUSP: 20.0000 mL | Freq: Once | INTRAMUSCULAR | Status: DC

## 2023-11-27 MED ORDER — ORAL CARE MOUTH RINSE: 15.0000 mL | Freq: Once | OROMUCOSAL | Status: AC

## 2023-11-27 MED ORDER — POVIDONE-IODINE 10 % EX SWAB: 2.0000 | Freq: Once | CUTANEOUS | Status: DC

## 2023-11-27 MED ORDER — ALUM & MAG HYDROXIDE-SIMETH 200-200-20 MG/5ML PO SUSP: 30.0000 mL | ORAL | Status: DC | PRN

## 2023-11-27 MED ORDER — CEFAZOLIN SODIUM-DEXTROSE 2-4 GM/100ML-% IV SOLN
2.0000 g | Freq: Four times a day (QID) | INTRAVENOUS | Status: AC
  Administered 2023-11-27 – 2023-11-28 (×2): 2 g via INTRAVENOUS
  Filled 2023-11-27 (×2): qty 100

## 2023-11-27 MED ORDER — SODIUM CHLORIDE 0.9 % IR SOLN
Status: DC | PRN
  Administered 2023-11-27: 1000 mL

## 2023-11-27 MED ORDER — METHOCARBAMOL 500 MG PO TABS
500.0000 mg | ORAL_TABLET | Freq: Four times a day (QID) | ORAL | Status: DC | PRN
  Administered 2023-11-27 – 2023-11-28 (×2): 500 mg via ORAL
  Filled 2023-11-27 (×2): qty 1

## 2023-11-27 MED ORDER — PROPOFOL 500 MG/50ML IV EMUL
INTRAVENOUS | Status: DC | PRN
  Administered 2023-11-27: 75 ug/kg/min via INTRAVENOUS

## 2023-11-27 MED ORDER — ACETAMINOPHEN 500 MG PO TABS
1000.0000 mg | ORAL_TABLET | Freq: Four times a day (QID) | ORAL | Status: AC
  Administered 2023-11-27 – 2023-11-28 (×4): 1000 mg via ORAL
  Filled 2023-11-27 (×4): qty 2

## 2023-11-27 MED ORDER — PANTOPRAZOLE SODIUM 40 MG PO TBEC
40.0000 mg | DELAYED_RELEASE_TABLET | Freq: Every day | ORAL | Status: DC
  Administered 2023-11-28: 40 mg via ORAL
  Filled 2023-11-27: qty 1

## 2023-11-27 MED ORDER — OXYCODONE HCL 5 MG PO TABS: 5.0000 mg | ORAL_TABLET | ORAL | 0 refills | Status: AC | PRN

## 2023-11-27 MED ORDER — METHOCARBAMOL 750 MG PO TABS: 750.0000 mg | ORAL_TABLET | Freq: Three times a day (TID) | ORAL | 0 refills | Status: AC | PRN

## 2023-11-27 MED ORDER — TRAMADOL HCL 50 MG PO TABS
50.0000 mg | ORAL_TABLET | Freq: Four times a day (QID) | ORAL | Status: DC
  Administered 2023-11-27 – 2023-11-28 (×4): 50 mg via ORAL
  Filled 2023-11-27 (×4): qty 1

## 2023-11-27 MED ORDER — PHENOL 1.4 % MT LIQD: 1.0000 | OROMUCOSAL | Status: DC | PRN

## 2023-11-27 MED ORDER — MAGNESIUM CITRATE PO SOLN: 1.0000 | Freq: Once | ORAL | Status: DC | PRN

## 2023-11-27 MED ORDER — PROPOFOL 10 MG/ML IV BOLUS
INTRAVENOUS | Status: DC | PRN
  Administered 2023-11-27: 20 mg via INTRAVENOUS

## 2023-11-27 MED ORDER — ONDANSETRON HCL 4 MG/2ML IJ SOLN
INTRAMUSCULAR | Status: DC | PRN
  Administered 2023-11-27: 4 mg via INTRAVENOUS

## 2023-11-27 MED ORDER — AMPHETAMINE-DEXTROAMPHET ER 10 MG PO CP24
20.0000 mg | ORAL_CAPSULE | ORAL | Status: DC
  Administered 2023-11-28: 20 mg via ORAL
  Filled 2023-11-27: qty 2

## 2023-11-27 MED ORDER — BUPIVACAINE IN DEXTROSE 0.75-8.25 % IT SOLN
INTRATHECAL | Status: DC | PRN
  Administered 2023-11-27: 1.6 mL via INTRATHECAL

## 2023-11-27 MED ORDER — CLONIDINE HCL (ANALGESIA) 100 MCG/ML EP SOLN
EPIDURAL | Status: DC | PRN
Start: 2023-11-27 — End: 2023-11-27
  Administered 2023-11-27: 100 ug

## 2023-11-27 MED ORDER — ONDANSETRON HCL 4 MG/2ML IJ SOLN
4.0000 mg | Freq: Once | INTRAMUSCULAR | Status: DC | PRN
Start: 2023-11-27 — End: 2023-11-27

## 2023-11-27 MED ORDER — TRANEXAMIC ACID-NACL 1000-0.7 MG/100ML-% IV SOLN: 1000.0000 mg | Freq: Once | INTRAVENOUS | Status: AC

## 2023-11-27 MED ORDER — SODIUM CHLORIDE 0.9 % IV SOLN
INTRAVENOUS | Status: DC | PRN
  Administered 2023-11-27: 80 mL

## 2023-11-27 SURGICAL SUPPLY — 43 items
BAG COUNTER SPONGE SURGICOUNT (BAG) IMPLANT
BLADE SAG 18X100X1.27 (BLADE) ×2 IMPLANT
BLADE SAGITTAL 25.0X1.37X90 (BLADE) ×2 IMPLANT
BLADE SURG 15 STRL LF DISP TIS (BLADE) ×2 IMPLANT
BNDG ELASTIC 6X10 VLCR STRL LF (GAUZE/BANDAGES/DRESSINGS) ×2 IMPLANT
CLSR STERI-STRIP ANTIMIC 1/2X4 (GAUZE/BANDAGES/DRESSINGS) ×4 IMPLANT
COVER SURGICAL LIGHT HANDLE (MISCELLANEOUS) ×2 IMPLANT
CUFF TRNQT CYL 34X4.125X (TOURNIQUET CUFF) ×2 IMPLANT
DRAPE U-SHAPE 47X51 STRL (DRAPES) ×2 IMPLANT
DRSG MEPILEX POST OP 4X12 (GAUZE/BANDAGES/DRESSINGS) ×2 IMPLANT
DURAPREP 26ML APPLICATOR (WOUND CARE) ×4 IMPLANT
ELECT REM PT RETURN 15FT ADLT (MISCELLANEOUS) ×2 IMPLANT
FEMORAL RETAINING CRUC KNEE #4 (Knees) IMPLANT
GLOVE BIO SURGEON STRL SZ7.5 (GLOVE) ×4 IMPLANT
GLOVE BIOGEL PI IND STRL 7.5 (GLOVE) ×2 IMPLANT
GLOVE BIOGEL PI IND STRL 8 (GLOVE) ×2 IMPLANT
GLOVE SURG SYN 7.0 (GLOVE) ×1 IMPLANT
GLOVE SURG SYN 7.0 PF PI (GLOVE) ×2 IMPLANT
GOWN STRL REUS W/ TWL LRG LVL3 (GOWN DISPOSABLE) ×2 IMPLANT
GOWN STRL REUS W/ TWL XL LVL3 (GOWN DISPOSABLE) ×2 IMPLANT
HOLDER FOLEY CATH W/STRAP (MISCELLANEOUS) IMPLANT
IMMOBILIZER KNEE 20 (SOFTGOODS) ×1 IMPLANT
IMMOBILIZER KNEE 20 THIGH 36 (SOFTGOODS) IMPLANT
IMMOBILIZER KNEE 22 UNIV (SOFTGOODS) IMPLANT
INSERT TRIATH X3 SZ4 9 (Insert) IMPLANT
KIT TURNOVER KIT A (KITS) IMPLANT
KNEE PATELLA ASYMMETRIC 10X32 (Knees) IMPLANT
KNEE TIBIAL COMP TRI SZ4 (Knees) IMPLANT
MANIFOLD NEPTUNE II (INSTRUMENTS) ×2 IMPLANT
NS IRRIG 1000ML POUR BTL (IV SOLUTION) ×2 IMPLANT
PACK TOTAL KNEE CUSTOM (KITS) ×2 IMPLANT
PIN FLUTED HEDLESS FIX 3.5X1/8 (PIN) IMPLANT
PROTECTOR NERVE ULNAR (MISCELLANEOUS) ×2 IMPLANT
SET HNDPC FAN SPRY TIP SCT (DISPOSABLE) ×2 IMPLANT
SPIKE FLUID TRANSFER (MISCELLANEOUS) ×2 IMPLANT
SUT MNCRL AB 3-0 PS2 18 (SUTURE) ×2 IMPLANT
SUT VIC AB 0 CT1 36 (SUTURE) ×2 IMPLANT
SUT VIC AB 1 CT1 36 (SUTURE) ×2 IMPLANT
SUT VIC AB 2-0 CT1 TAPERPNT 27 (SUTURE) ×2 IMPLANT
TOWEL GREEN STERILE FF (TOWEL DISPOSABLE) ×2 IMPLANT
TRAY FOLEY MTR SLVR 14FR STAT (SET/KITS/TRAYS/PACK) IMPLANT
TUBE SUCTION HIGH CAP CLEAR NV (SUCTIONS) ×2 IMPLANT
WRAP KNEE MAXI GEL POST OP (GAUZE/BANDAGES/DRESSINGS) ×2 IMPLANT

## 2023-11-27 NOTE — Evaluation (Signed)
Physical Therapy Evaluation Patient Details Name: Kelly Montes MRN: [id number] DOB: Jul 17, 1951 Today's Date: 11/27/2023  History of Present Illness  Pt is 73 yo female admitted on 11/27/23 with L TKA.  Pt with hx including GERD, OA, R TKA 2017  Clinical Impression  Pt is s/p TKA resulting in the deficits listed below (see PT Problem List). At baseline, pt is independent.  She has DME and home support.  PT saw pt in PACU for possible same day d/c but pt became syncopal after walking to bathroom.  Pt was returned to bed with assist from nursing, BP 85/71, pt arousing after 10-15 sec in supine.  Will f/u with pt tomorrow.  Pt will benefit from acute skilled PT to increase their independence and safety with mobility to allow discharge.          If plan is discharge home, recommend the following: A little help with walking and/or transfers;A little help with bathing/dressing/bathroom;Assistance with cooking/housework;Help with stairs or ramp for entrance   Can travel by private vehicle        Equipment Recommendations None recommended by PT  Recommendations for Other Services       Functional Status Assessment Patient has had a recent decline in their functional status and demonstrates the ability to make significant improvements in function in a reasonable and predictable amount of time.     Precautions / Restrictions Precautions Precautions: Fall;Knee Restrictions Weight Bearing Restrictions Per Provider Order: Yes LLE Weight Bearing Per Provider Order: Weight bearing as tolerated      Mobility  Bed Mobility Overal bed mobility: Needs Assistance Bed Mobility: Supine to Sit, Sit to Supine     Supine to sit: Min assist Sit to supine: Total assist, +2 for physical assistance   General bed mobility comments: To sit: increased time and min A for L LE; To supine: total x 3 - pt syncopal    Transfers Overall transfer level: Needs assistance Equipment used: Rolling walker  (2 wheels) Transfers: Sit to/from Stand, Bed to chair/wheelchair/BSC Sit to Stand: Min assist   Step pivot transfers: Min assist       General transfer comment: STS from bed and toilet with min A and cues for L LE management.  Pt becoming syncopal while in bathroom - was transferred to transport chair, back to bed with total assist pivot/slide    Ambulation/Gait Ambulation/Gait assistance: Min assist Gait Distance (Feet): 30 Feet Assistive device: Rolling walker (2 wheels) Gait Pattern/deviations: Step-to pattern, Decreased stance time - left, Decreased weight shift to left, Antalgic Gait velocity: decreased     General Gait Details: Ambulated to bathroom , antalgic, cues for RW  Stairs            Wheelchair Mobility     Tilt Bed    Modified Rankin (Stroke Patients Only)       Balance Overall balance assessment: Needs assistance Sitting-balance support: No upper extremity supported Sitting balance-Leahy Scale: Good     Standing balance support: Bilateral upper extremity supported Standing balance-Leahy Scale: Poor Standing balance comment: RW                             Pertinent Vitals/Pain Pain Assessment Pain Assessment: 0-10 Pain Score: 5  Pain Location: L knee Pain Descriptors / Indicators: Aching Pain Intervention(s): Limited activity within patient's tolerance, Premedicated before session, Monitored during session, Ice applied    Home Living Family/patient expects to be discharged to:: Private  residence Living Arrangements: Spouse/significant other Available Help at Discharge: Family;Available 24 hours/day (spouse and daughter) Type of Home: House Home Access: Stairs to enter Entrance Stairs-Rails: Right Entrance Stairs-Number of Steps: 2   Home Layout: One level Home Equipment: BSC/3in1;Rolling Walker (2 wheels);Cane - single point;Shower seat;Adaptive equipment      Prior Function Prior Level of Function : Independent/Modified  Independent;Driving             Mobility Comments: Could ambulate in community; used a cane at times due to knee pain ADLs Comments: Could do adls and iadls     Extremity/Trunk Assessment   Upper Extremity Assessment Upper Extremity Assessment: Overall WFL for tasks assessed    Lower Extremity Assessment Lower Extremity Assessment: LLE deficits/detail;RLE deficits/detail RLE Deficits / Details: ROM WFL; MMT 5;5 LLE Deficits / Details: Expected post op changes; ROM: knee 5 to 70 degrees; MMT: ankle 5/5, knee 1/5, hip 3/5    Cervical / Trunk Assessment Cervical / Trunk Assessment: Normal  Communication      Cognition Arousal: Alert Behavior During Therapy: WFL for tasks assessed/performed Overall Cognitive Status: Within Functional Limits for tasks assessed                                          General Comments General comments (skin integrity, edema, etc.): Pt ambulated to bathroom - began feeling nauseated then lightheaded.  Obtained transport chair , rolled back to bed, pt syncopal and required total x 3 transfer back to bed.  RN present.  BP was 85/71 in supine, pt diaphoretic and pale, became alert after 10-15 sec in supine.  After 5 min BP up to 119/63 and pt alert and talking.    Exercises     Assessment/Plan    PT Assessment Patient needs continued PT services  PT Problem List Decreased strength;Decreased coordination;Decreased range of motion;Decreased cognition;Decreased activity tolerance;Decreased balance;Decreased mobility;Decreased knowledge of use of DME;Cardiopulmonary status limiting activity       PT Treatment Interventions DME instruction;Therapeutic exercise;Gait training;Balance training;Stair training;Functional mobility training;Therapeutic activities;Patient/family education;Modalities    PT Goals (Current goals can be found in the Care Plan section)  Acute Rehab PT Goals Patient Stated Goal: return home PT Goal Formulation:  With patient/family Time For Goal Achievement: 12/11/23 Potential to Achieve Goals: Good    Frequency 7X/week     Co-evaluation               AM-PAC PT "6 Clicks" Mobility  Outcome Measure Help needed turning from your back to your side while in a flat bed without using bedrails?: A Little Help needed moving from lying on your back to sitting on the side of a flat bed without using bedrails?: A Little Help needed moving to and from a bed to a chair (including a wheelchair)?: A Little Help needed standing up from a chair using your arms (e.g., wheelchair or bedside chair)?: A Little Help needed to walk in hospital room?: A Little Help needed climbing 3-5 steps with a railing? : A Lot 6 Click Score: 17    End of Session Equipment Utilized During Treatment: Gait belt Activity Tolerance: Treatment limited secondary to medical complications (Comment) Patient left: in bed;with call bell/phone within reach;with family/visitor present;with nursing/sitter in room Nurse Communication: Mobility status PT Visit Diagnosis: Other abnormalities of gait and mobility (R26.89);Muscle weakness (generalized) (M62.81)    Time: 7425-9563 PT Time Calculation (min) (  ACUTE ONLY): 36 min   Charges:   PT Evaluation $PT Eval Moderate Complexity: 1 Mod PT Treatments $Gait Training: 8-22 mins PT General Charges $$ ACUTE PT VISIT: 1 Visit         Anise Salvo, PT Acute Rehab [facility] [phone]   Rayetta Humphrey 11/27/2023, 6:06 PM

## 2023-11-27 NOTE — Progress Notes (Signed)
Orthopedic Tech Progress Note Patient Details:  Kelly Montes [birth year] [id number]  CPM Left Knee CPM Left Knee: On Left Knee Flexion (Degrees): 80 Left Knee Extension (Degrees): 0  Post Interventions Patient Tolerated: Well  Tonye Pearson 11/27/2023, 6:49 PM

## 2023-11-27 NOTE — Anesthesia Postprocedure Evaluation (Signed)
Anesthesia Post Note  Patient: Kelly Montes  Procedure(s) Performed: TOTAL KNEE ARTHROPLASTY (Left: Knee)     Patient location during evaluation: Nursing Unit Anesthesia Type: Regional and Spinal Level of consciousness: oriented and awake and alert Pain management: pain level controlled Vital Signs Assessment: post-procedure vital signs reviewed and stable Respiratory status: spontaneous breathing and respiratory function stable Cardiovascular status: blood pressure returned to baseline and stable Postop Assessment: no headache, no backache, no apparent nausea or vomiting and patient able to bend at knees Anesthetic complications: no   No notable events documented.  Last Vitals:  Vitals:   11/27/23 1445 11/27/23 1500  BP: 127/61 (!) 123/57  Pulse: (!) 55 69  Resp: 10 12  Temp:    SpO2: 96% 97%    Last Pain:  Vitals:   11/27/23 1500  TempSrc:   PainSc: 0-No pain                 Trevor Iha

## 2023-11-27 NOTE — Anesthesia Procedure Notes (Signed)
Anesthesia Regional Block: Adductor canal block   Pre-Anesthetic Checklist: , timeout performed,  Correct Patient, Correct Site, Correct Laterality,  Correct Procedure, Correct Position, site marked,  Risks and benefits discussed,  Surgical consent,  Pre-op evaluation,  At surgeon's request and post-op pain management  Laterality: Lower and Left  Prep: chloraprep       Needles:  Injection technique: Single-shot  Needle Type: Echogenic Needle     Needle Length: 9cm  Needle Gauge: 22     Additional Needles:   Procedures:,,,, ultrasound used (permanent image in chart),,    Narrative:  Start time: 11/27/2023 10:40 AM End time: 11/27/2023 10:46 AM Injection made incrementally with aspirations every 5 mL.  Performed by: Personally  Anesthesiologist: Trevor Iha, MD  Additional Notes: Block assessed prior to surgery. Pt tolerated procedure well.

## 2023-11-27 NOTE — Transfer of Care (Signed)
Immediate Anesthesia Transfer of Care Note  Patient: Kelly Montes  Procedure(s) Performed: TOTAL KNEE ARTHROPLASTY (Left: Knee)  Patient Location: PACU  Anesthesia Type:Spinal and MAC combined with regional for post-op pain  Level of Consciousness: awake, alert , and oriented  Airway & Oxygen Therapy: Patient connected to face mask oxygen  Post-op Assessment: Report given to RN and Post -op Vital signs reviewed and stable  Post vital signs: Reviewed and stable  Last Vitals:  Vitals Value Taken Time  BP 115/56 11/27/23 1348  Temp 36.4 C 11/27/23 1348  Pulse 60 11/27/23 1353  Resp 16 11/27/23 1348  SpO2 100 % 11/27/23 1353  Vitals shown include unfiled device data.  Last Pain:  Vitals:   11/27/23 1348  TempSrc:   PainSc: 0-No pain         Complications: No notable events documented.

## 2023-11-27 NOTE — Anesthesia Procedure Notes (Signed)
Spinal  Patient location during procedure: OR Start time: 11/27/2023 11:53 AM End time: 11/27/2023 11:58 AM Reason for block: surgical anesthesia Staffing Performed: resident/CRNA  Resident/CRNA: Elyn Peers, CRNA Performed by: Elyn Peers, CRNA Authorized by: Trevor Iha, MD   Preanesthetic Checklist Completed: patient identified, IV checked, site marked, risks and benefits discussed, surgical consent, monitors and equipment checked, pre-op evaluation and timeout performed Spinal Block Patient position: sitting Prep: DuraPrep and site prepped and draped Patient monitoring: heart rate, continuous pulse ox, blood pressure and cardiac monitor Approach: midline Location: L3-4 Injection technique: single-shot Needle Needle type: Pencan  Needle gauge: 24 G Needle length: 10 cm Assessment Events: CSF return Additional Notes Expiration date on kit noted and within range.  Sterile prep and drape.    Local with Lidocaine 1%.  Good CSF flow noted with no heme or c/o paresthesia.   Patient tolerated well.

## 2023-11-27 NOTE — Discharge Instructions (Signed)

## 2023-11-27 NOTE — Progress Notes (Signed)
Orthopedic Tech Progress Note Patient Details:  Kelly Montes [birth year] [id number]  CPM Left Knee CPM Left Knee: Off Left Knee Flexion (Degrees): 80 Left Knee Extension (Degrees): 0  Post Interventions Patient Tolerated: Well  Tonye Pearson 11/27/2023, 8:17 PM

## 2023-11-27 NOTE — Interval H&P Note (Signed)
History and Physical Interval Note:  11/27/2023 10:25 AM  Kelly Montes  has presented today for surgery, with the diagnosis of OA LEFT KNEE.  The various methods of treatment have been discussed with the patient and family. After consideration of risks, benefits and other options for treatment, the patient has consented to  Procedure(s): TOTAL KNEE ARTHROPLASTY (Left) as a surgical intervention.  The patient's history has been reviewed, patient examined, no change in status, stable for surgery.  I have reviewed the patient's chart and labs.  Questions were answered to the patient's satisfaction.     Sheral Apley

## 2023-11-27 NOTE — Op Note (Signed)
DATE OF SURGERY:  11/27/2023 TIME: 1:11 PM  PATIENT NAME:  Kelly Montes   AGE: 73 y.o.    PRE-OPERATIVE DIAGNOSIS:  OA LEFT KNEE  POST-OPERATIVE DIAGNOSIS:  Same  PROCEDURE:  Procedure(s): TOTAL KNEE ARTHROPLASTY   SURGEON:  Sheral Apley, MD   ASSISTANT:  Levester Fresh, PA-C, she was present and scrubbed throughout the case, critical for completion in a timely fashion, and for retraction, instrumentation, and closure.    OPERATIVE IMPLANTS: [employer] Triathlon CR. Press fit knee  Femur size 4, Tibia size 4, Patella size 32 3-peg oval button, with a 9 mm polyethylene insert.   PREOPERATIVE INDICATIONS:  [name] is a 73 y.o. year old female with end stage bone on bone degenerative arthritis of the knee who failed conservative treatment, including injections, antiinflammatories, activity modification, and assistive devices, and had significant impairment of their activities of daily living, and elected for Total Knee Arthroplasty.   The risks, benefits, and alternatives were discussed at length including but not limited to the risks of infection, bleeding, nerve injury, stiffness, blood clots, the need for revision surgery, cardiopulmonary complications, among others, and they were willing to proceed.   OPERATIVE DESCRIPTION:  The patient was brought to the operative room and placed in a supine position.  General anesthesia was administered.  IV antibiotics were given.  The lower extremity was prepped and draped in the usual sterile fashion.  Time out was performed.  The leg was elevated and exsanguinated and the tourniquet was inflated.  Anterior approach was performed.  The patella was everted and osteophytes were removed.  The anterior horn of the medial and lateral meniscus was removed.   The distal femur was opened with the drill and the intramedullary distal femoral cutting jig was utilized, set at 5 degrees resecting 9 mm off the distal femur.  Care was  taken to protect the collateral ligaments.  The distal femoral sizing jig was applied, taking care to avoid notching.  Then the 4-in-1 cutting jig was applied and the anterior and posterior femur was cut, along with the chamfer cuts.  All posterior osteophytes were removed.  The flexion gap was then measured and was symmetric with the extension gap.  Then the extramedullary tibial cutting jig was utilized making the appropriate cut using the anterior tibial crest as a reference building in appropriate posterior slope.  Care was taken during the cut to protect the medial and collateral ligaments.  The proximal tibia was removed along with the posterior horns of the menisci.    I completed the distal femoral preparation using the appropriate jig to prepare the box.  The patella was then measured, and cut with the saw.    The proximal tibia sized and prepared accordingly with the reamer and the punch, and then all components were trialed with the above sized poly insert.  The knee was found to have excellent balance and full motion.    The above named components were then impacted into place and Poly tibial piece and patella were inserted.  I was very happy with his stability and ROM  I performed a periarticular injection with Exparel  The knee was easily taken through a range of motion and the patella tracked well and the knee irrigated copiously and the parapatellar and subcutaneous tissue closed with vicryl, and monocryl with steri strips for the skin.  The incision was dressed with sterile gauze and the tourniquet released and the patient was awakened and returned to the PACU in  stable and satisfactory condition.  There were no complications.  Total tourniquet time was roughly 75 minutes.   POSTOPERATIVE PLAN: post op Abx, DVT px: SCD's, TED's, Early ambulation and chemical px

## 2023-11-27 NOTE — Anesthesia Procedure Notes (Signed)
Procedure Name: MAC Date/Time: 11/27/2023 11:50 AM  Performed by: Elyn Peers, CRNAPre-anesthesia Checklist: Patient identified, Emergency Drugs available, Suction available, Patient being monitored and Timeout performed Oxygen Delivery Method: Simple face mask Placement Confirmation: positive ETCO2

## 2023-11-28 ENCOUNTER — Encounter (HOSPITAL_COMMUNITY): Payer: Self-pay | Admitting: Orthopedic Surgery

## 2023-11-28 DIAGNOSIS — M1712 Unilateral primary osteoarthritis, left knee: Secondary | ICD-10-CM | POA: Diagnosis not present

## 2023-11-28 LAB — COMPREHENSIVE METABOLIC PANEL
ALT: 21 U/L (ref 0–44)
AST: 27 U/L (ref 15–41)
Albumin: 3.7 g/dL (ref 3.5–5.0)
Alkaline Phosphatase: 67 U/L (ref 38–126)
Anion gap: 10 (ref 5–15)
BUN: 14 mg/dL (ref 8–23)
CO2: 24 mmol/L (ref 22–32)
Calcium: 8.8 mg/dL — ABNORMAL LOW (ref 8.9–10.3)
Chloride: 102 mmol/L (ref 98–111)
Creatinine, Ser: 0.47 mg/dL (ref 0.44–1.00)
GFR, Estimated: >60 mL/min (ref >60)
Glucose, Bld: 122 mg/dL — ABNORMAL HIGH (ref 70–99)
Potassium: 3.9 mmol/L (ref 3.5–5.1)
Sodium: 136 mmol/L (ref 135–145)
Total Bilirubin: 0.8 mg/dL (ref 0.0–1.2)
Total Protein: 6.8 g/dL (ref 6.5–8.1)

## 2023-11-28 LAB — CBC WITH DIFFERENTIAL/PLATELET
Abs Immature Granulocytes: 0.06 10*3/uL (ref 0.00–0.07)
Basophils Absolute: 0 10*3/uL (ref 0.0–0.1)
Basophils Relative: 0 %
Eosinophils Absolute: 0 10*3/uL (ref 0.0–0.5)
Eosinophils Relative: 0 %
HCT: 37.8 % (ref 36.0–46.0)
Hemoglobin: 12.1 g/dL (ref 12.0–15.0)
Immature Granulocytes: 1 %
Lymphocytes Relative: 6 %
Lymphs Abs: 0.8 10*3/uL (ref 0.7–4.0)
MCH: 29 pg (ref 26.0–34.0)
MCHC: 32 g/dL (ref 30.0–36.0)
MCV: 90.6 fL (ref 80.0–100.0)
Monocytes Absolute: 0.4 10*3/uL (ref 0.1–1.0)
Monocytes Relative: 3 %
Neutro Abs: 11.5 10*3/uL — ABNORMAL HIGH (ref 1.7–7.7)
Neutrophils Relative %: 90 %
Platelets: 245 10*3/uL (ref 150–400)
RBC: 4.17 MIL/uL (ref 3.87–5.11)
RDW: 13.3 % (ref 11.5–15.5)
WBC: 12.7 10*3/uL — ABNORMAL HIGH (ref 4.0–10.5)
nRBC: 0 % (ref 0.0–0.2)

## 2023-11-28 NOTE — Plan of Care (Signed)
Patient discharged home via private vehicle with husband and daughter. AVS and discharge instruction provided. Patient verbalizes understanding. Haydee Salter, RN 11/28/23 5:53 PM

## 2023-11-28 NOTE — Progress Notes (Signed)
Physical Therapy Treatment Patient Details Name: Kelly Montes MRN: [id number] DOB: 07/19/1951 Today's Date: 11/28/2023   History of Present Illness Pt is 73 yo female admitted on 11/27/23 with L TKA.  Pt with hx including GERD, OA, R TKA 2017    PT Comments  Pt is POD # 1 and is progressing well.  Pt with improved pain control and quad activation.  She was able to ambulate 70' with RW and CGA and performed stairs similar to home set up.  Pt became hot/diaphoretic with stairs but no syncopal symptoms, reports she typically gets hot flashes, and all VSS.  She demonstrated good understanding of HEP and has outpt PT scheduled later in the week. Pt demonstrates safe gait & transfers in order to return home from PT perspective once discharged by MD.  While in hospital, will continue to benefit from PT for skilled therapy to advance mobility and exercises.       If plan is discharge home, recommend the following: A little help with walking and/or transfers;A little help with bathing/dressing/bathroom;Assistance with cooking/housework;Help with stairs or ramp for entrance   Can travel by private vehicle        Equipment Recommendations  None recommended by PT    Recommendations for Other Services       Precautions / Restrictions Precautions Precautions: Fall;Knee Restrictions LLE Weight Bearing Per Provider Order: Weight bearing as tolerated     Mobility  Bed Mobility Overal bed mobility: Needs Assistance Bed Mobility: Supine to Sit     Supine to sit: Min assist     General bed mobility comments: Light min A for L LE    Transfers Overall transfer level: Needs assistance Equipment used: Rolling walker (2 wheels) Transfers: Sit to/from Stand Sit to Stand: Supervision           General transfer comment: Performed x 2 with close supervision, cues for L LE management    Ambulation/Gait Ambulation/Gait assistance: Supervision, Contact guard assist Gait Distance (Feet):  70 Feet Assistive device: Rolling walker (2 wheels) Gait Pattern/deviations: Step-to pattern, Decreased stance time - left, Decreased weight shift to left, Antalgic Gait velocity: decreased     General Gait Details: Antalgic but stable.  Started with decreased dorsiflexion (walking on toes) on L but progressed to foot flat   Stairs Stairs: Yes Stairs assistance: Contact guard assist Stair Management: Two rails, One rail Right, With cane, Forwards Number of Stairs: 8 ((4x2)) General stair comments: Started with bil rails but progressed to rail on R and cane L to simiulate home environment.  Pt recalling correct sequence.  Performed safely   Wheelchair Mobility     Tilt Bed    Modified Rankin (Stroke Patients Only)       Balance Overall balance assessment: Needs assistance Sitting-balance support: No upper extremity supported Sitting balance-Leahy Scale: Good     Standing balance support: Bilateral upper extremity supported, No upper extremity supported Standing balance-Leahy Scale: Fair Standing balance comment: RW to ambulate - steady; could static stand without AD                            Cognition Arousal: Alert Behavior During Therapy: WFL for tasks assessed/performed Overall Cognitive Status: Within Functional Limits for tasks assessed                                 General Comments: Very pleasant,  motivated, familiar with TKA rehab from prior TKA        Exercises Total Joint Exercises Ankle Circles/Pumps: AROM, Both, 10 reps, Supine Quad Sets: AROM, Both, 10 reps, Supine Heel Slides: AAROM, Left, 5 reps, Supine Hip ABduction/ADduction: AAROM, Left, 5 reps, Supine Long Arc Quad: AAROM, Left, 10 reps, Seated Knee Flexion: AAROM, Left, 10 reps, Seated Goniometric ROM: L knee ~5 to 50 degrees Other Exercises Other Exercises: Educated on AAROM techniques were needed and only performing exercises to tolerance initially    General  Comments General comments (skin integrity, edema, etc.): Pt became hot and diaphoretic after ambulating and stairs.  Reports only hot and no syncopal symptoms.  States has hot flashes at home with activity.  BP was stable 138/75.  Educated on safe ice use, no pivots, car transfers, resting with leg straight, and TED hose during day. Also, encouraged walking every 1-2 hours during day. Educated on HEP with focus on mobility the first weeks. Discussed doing exercises within pain control and if pain increasing could decreased ROM, reps, and stop exercises as needed. Encouraged to perform quad sets and ankle pumps frequently for blood flow and to promote full knee extension.       Pertinent Vitals/Pain Pain Assessment Pain Assessment: 0-10 Pain Score: 4  Pain Location: L knee Pain Descriptors / Indicators: Aching Pain Intervention(s): Limited activity within patient's tolerance, Monitored during session, Premedicated before session, Repositioned, Ice applied    Home Living                          Prior Function            PT Goals (current goals can now be found in the care plan section) Progress towards PT goals: Progressing toward goals    Frequency    7X/week      PT Plan      Co-evaluation              AM-PAC PT "6 Clicks" Mobility   Outcome Measure  Help needed turning from your back to your side while in a flat bed without using bedrails?: A Little Help needed moving from lying on your back to sitting on the side of a flat bed without using bedrails?: A Little Help needed moving to and from a bed to a chair (including a wheelchair)?: A Little Help needed standing up from a chair using your arms (e.g., wheelchair or bedside chair)?: A Little Help needed to walk in hospital room?: A Little Help needed climbing 3-5 steps with a railing? : A Little 6 Click Score: 18    End of Session Equipment Utilized During Treatment: Gait belt Activity Tolerance:  Patient tolerated treatment well Patient left: with call bell/phone within reach;with chair alarm set;in chair Nurse Communication: Mobility status PT Visit Diagnosis: Other abnormalities of gait and mobility (R26.89);Muscle weakness (generalized) (M62.81)     Time: 1125-1203 PT Time Calculation (min) (ACUTE ONLY): 38 min  Charges:    $Gait Training: 8-22 mins $Therapeutic Exercise: 8-22 mins $Therapeutic Activity: 8-22 mins PT General Charges $$ ACUTE PT VISIT: 1 Visit                     Anise Salvo, PT Acute Rehab [facility] [phone]    Rayetta Humphrey 11/28/2023, 12:40 PM

## 2023-11-28 NOTE — TOC Transition Note (Signed)
Transition of Care [facility]) - Discharge Note   Patient Details  Name: Kelly Montes MRN: [id number] Date of Birth: 1951-06-05  Transition of [facility] CM/SW Contact:  Howell Rucks, RN Phone Number: 11/28/2023, 10:55 AM   Clinical Narrative:   Met with pt at bedside to review dc follow up therapy and DME needs, pt confirmed OOPT, no home DME needs. No TOC needs.     Final next level of care: OP Rehab Barriers to Discharge: No Barriers Identified   Patient Goals and CMS Choice Patient states their goals for this hospitalization and ongoing recovery are:: return home          Discharge Placement                       Discharge Plan and Services Additional resources added to the After Visit Summary for                                       Social Drivers of Health (SDOH) Interventions SDOH Screenings   Food Insecurity: No Food Insecurity (11/27/2023)  Housing: Low Risk  (11/27/2023)  Transportation Needs: No Transportation Needs (11/27/2023)  Utilities: Not At Risk (11/27/2023)  Alcohol Screen: Low Risk  (12/20/2021)  Depression (PHQ2-9): Low Risk  (12/20/2021)  Financial Resource Strain: Low Risk  (04/05/2023)   Received from [facility]  Physical Activity: Insufficiently Active (04/05/2023)   Received from [facility]  Social Connections: Socially Integrated (11/27/2023)  Stress: No Stress Concern Present (04/05/2023)   Received from [facility]  Tobacco Use: Unknown (11/27/2023)     Readmission Risk Interventions     No data to display

## 2023-11-28 NOTE — Progress Notes (Signed)
    Subjective: Patient reports pain as moderate.  Tolerating diet.  Urinating.   No CP, SOB.  Has mobilized some OOB with PT yesterday but then had a vasovagal event in the bathroom and ended up needing to be admitted overnight to observation. Was difficult to arouse and hypotensive at first. Better with IVF.   Objective:   VITALS:   Vitals:   11/27/23 2236 11/28/23 0214 11/28/23 0700 11/28/23 0929  BP: 126/63 124/68 117/63 135/62  Pulse: 71 67 (!) 105 96  Resp: 18 18 18 18   Temp: 98.5 F (36.9 C) 98.1 F (36.7 C) 98.3 F (36.8 C) 98.2 F (36.8 C)  TempSrc: Oral Oral Oral Oral  SpO2: 92% 94% 96% 96%  Weight:      Height:          Latest Ref Rng & Units 11/15/2023   11:44 AM 11/24/2015    6:45 AM 11/23/2015    6:30 AM  CBC  WBC 4.0 - 10.5 K/uL 6.2  8.9  10.8   Hemoglobin 12.0 - 15.0 g/dL 69.6  9.9  29.5   Hematocrit 36.0 - 46.0 % 45.7  31.2  35.1   Platelets 150 - 400 K/uL 258  214  233       Latest Ref Rng & Units 11/24/2015    6:45 AM 11/23/2015    6:30 AM 11/22/2015    2:17 PM  BMP  Glucose 65 - 99 mg/dL 98  284    BUN 6 - 20 mg/dL 11  7    Creatinine 1.32 - 1.00 mg/dL 4.40  1.02  7.25   Sodium 135 - 145 mmol/L 137  137    Potassium 3.5 - 5.1 mmol/L 3.7  4.1    Chloride 101 - 111 mmol/L 104  104    CO2 22 - 32 mmol/L 26  23    Calcium 8.9 - 10.3 mg/dL 8.5  8.3     Intake/Output      01/21 0701 01/22 0700 01/22 0701 01/23 0700   P.O. 840 240   I.V. (mL/kg) 1244.7 (14.6)    IV Piggyback 470.1    Total Intake(mL/kg) 2554.8 (30) 240 (2.8)   Urine (mL/kg/hr) 1700 400 (0.9)   Stool 0    Blood 10    Total Output 1710 400   Net +844.8 -160           Physical Exam: General: NAD.  Sitting on bedside commode, calm Resp: No increased wob Cardio: regular rate and rhythm ABD soft Neurologically intact MSK Neurovascularly intact Sensation intact distally Intact pulses distally Dorsiflexion/Plantar flexion intact Incision: dressing C/D/I   Assessment: 1  Day Post-Op  S/P Procedure(s) (LRB): TOTAL KNEE ARTHROPLASTY (Left) by Dr. Jewel Baize. Murphy on 11/27/23  Principal Problem:   S/P total knee arthroplasty, left   Plan: Will check updated labs  Advance diet Up with therapy Incentive Spirometry Elevate and Apply ice  Weightbearing: WBAT LLE Insicional and dressing care: Dressings left intact until follow-up and Reinforce dressings as needed Orthopedic device(s):  CPM Showering: Keep dressing dry VTE prophylaxis: Aspirin 81mg  BID  x 30 days postop , SCDs, ambulation Pain control: PRN Follow - up plan: 2 weeks Contact information:  Margarita Rana MD, Levester Fresh PA-C  Dispo: Home hopefully later today if pain controlled, passes PT evaluation, and feeling overall better.     Jenne Pane, PA-C Office [phone] 11/28/2023, 12:24 PM

## 2023-11-28 NOTE — Care Management Obs Status (Signed)
MEDICARE OBSERVATION STATUS NOTIFICATION   Patient Details  Name: Kelly Montes MRN: [id number] Date of Birth: Nov 22, 1950   Medicare Observation Status Notification Given:  Yes    Howell Rucks, RN 11/28/2023, 10:09 AM

## 2023-11-28 NOTE — Plan of Care (Signed)
  Problem: Pain Managment: Goal: General experience of comfort will improve and/or be controlled Outcome: Progressing   Problem: Safety: Goal: Ability to remain free from injury will improve Outcome: Progressing

## 2023-11-28 NOTE — Discharge Summary (Signed)
Physician Discharge Summary  Patient ID: Kelly Montes MRN: [id number] DOB/AGE: January 11, 1951 73 y.o.  Admit date: 11/27/2023 Discharge date: 11/28/2023  Admission Diagnoses: left knee OA  Discharge Diagnoses:  Principal Problem:   S/P total knee arthroplasty, left   Discharged Condition: fair  Hospital Course: Patient underwent a left TKA by Dr. Eulah Pont on 11/27/23 without complications and was supposed to discharge same day but ended up having an episode of syncope in the bathroom of PACU and had to spend the night in observation. She is now feeling better and has passed her PT evaluation and is ready to discharge home.   Consults: None  Significant Diagnostic Studies: n/a  Treatments: IV hydration, antibiotics: Ancef, analgesia: acetaminophen, Dilaudid, and Oxycodone, anticoagulation: ASA, therapies: PT and SW, and surgery: left TKA  Discharge Exam: Blood pressure (!) 147/63, pulse 78, temperature 98.8 F (37.1 C), temperature source Oral, resp. rate 18, height 5\' 6"  (1.676 m), weight 85.3 kg, SpO2 97%. General appearance: alert, cooperative, and no distress Head: Normocephalic, without obvious abnormality, atraumatic Resp: clear to auscultation bilaterally Cardio: regular rate and rhythm Extremities: extremities normal, atraumatic, no cyanosis or edema Pulses:  L brachial 2+ R brachial 2+  L radial 2+ R radial 2+  L inguinal 2+ R inguinal 2+  L popliteal 2+ R popliteal 2+  L posterior tibial 2+ R posterior tibial 2+  L dorsalis pedis 2+ R dorsalis pedis 2+   Neurologic: Grossly normal Incision/Wound: c/d/i  Disposition: Discharge disposition: 01-Home or Self Care       Discharge Instructions     CPM   Complete by: As directed    Continuous passive motion machine (CPM):      Use the CPM from 0 to 90 degrees for 4-6 hours per day.      You may break it up into 2 or 3 sessions per day.      Use CPM for 3 weeks or until you are told to stop.   Call MD / Call  911   Complete by: As directed    If you experience chest pain or shortness of breath, CALL 911 and be transported to the hospital emergency room.  If you develope a fever above 101 F, pus (white drainage) or increased drainage or redness at the wound, or calf pain, call your surgeon's office.   Call MD / Call 911   Complete by: As directed    If you experience chest pain or shortness of breath, CALL 911 and be transported to the hospital emergency room.  If you develope a fever above 101 F, pus (white drainage) or increased drainage or redness at the wound, or calf pain, call your surgeon's office.   Diet - low sodium heart healthy   Complete by: As directed    Diet - low sodium heart healthy   Complete by: As directed    Discharge instructions   Complete by: As directed    You may bear weight as tolerated. Keep your dressing on and dry until follow up. Take medicine to prevent blood clots as directed. Take pain medicine as needed with the goal of transitioning to over the counter medicines. It is okay to take 2 tablets of your narcotic medicine instead of just 1 tablet in the first 1-2 days after surgery when pain is at the worst. Do not continue to do this for multiple days at a time beyond that though.    INSTRUCTIONS AFTER JOINT REPLACEMENT   Remove items at home  which could result in a fall. This includes throw rugs or furniture in walking pathways ICE to the affected joint every three hours while awake for 30 minutes at a time, for at least the first 3-5 days, and then as needed for pain and swelling.  Continue to use ice for pain and swelling. You may notice swelling that will progress down to the foot and ankle.  This is normal after surgery.  Elevate your leg when you are not up walking on it.   Continue to use the breathing machine you got in the hospital (incentive spirometer) which will help keep your temperature down.  It is common for your temperature to cycle up and down  following surgery, especially at night when you are not up moving around and exerting yourself.  The breathing machine keeps your lungs expanded and your temperature down.   DIET:  As you were doing prior to hospitalization, we recommend a well-balanced diet.  DRESSING / WOUND CARE / SHOWERING  You may shower 3 days after surgery, but keep the wounds dry during showering.  You may use an occlusive plastic wrap (Press'n Seal for example) with blue painter's tape at edges, NO SOAKING/SUBMERGING IN THE BATHTUB.  If the bandage gets wet, call the office.   ACTIVITY  Increase activity slowly as tolerated, but follow the weight bearing instructions below.   No driving for 6 weeks or until further direction given by your physician.  You cannot drive while taking narcotics.  No lifting or carrying greater than 10 lbs. until further directed by your surgeon. Avoid periods of inactivity such as sitting longer than an hour when not asleep. This helps prevent blood clots.  You may return to work once you are authorized by your doctor.    WEIGHT BEARING   Weight bearing as tolerated with assist device (walker, cane, etc) as directed, use it as long as suggested by your surgeon or therapist, typically at least 4-6 weeks.   EXERCISES  Results after joint replacement surgery are often greatly improved when you follow the exercise, range of motion and muscle strengthening exercises prescribed by your doctor. Safety measures are also important to protect the joint from further injury. Any time any of these exercises cause you to have increased pain or swelling, decrease what you are doing until you are comfortable again and then slowly increase them. If you have problems or questions, call your caregiver or physical therapist for advice.   Rehabilitation is important following a joint replacement. After just a few days of immobilization, the muscles of the leg can become weakened and shrink (atrophy).   These exercises are designed to build up the tone and strength of the thigh and leg muscles and to improve motion. Often times heat used for twenty to thirty minutes before working out will loosen up your tissues and help with improving the range of motion but do not use heat for the first two weeks following surgery (sometimes heat can increase post-operative swelling).   These exercises can be done on a training (exercise) mat, on the floor, on a table or on a bed. Use whatever works the best and is most comfortable for you.    Use music or television while you are exercising so that the exercises are a pleasant break in your day. This will make your life better with the exercises acting as a break in your routine that you can look forward to.   Perform all exercises about fifteen times,  three times per day or as directed.  You should exercise both the operative leg and the other leg as well.  Exercises include:   Quad Sets - Tighten up the muscle on the front of the thigh (Quad) and hold for 5-10 seconds.   Straight Leg Raises - With your knee straight (if you were given a brace, keep it on), lift the leg to 60 degrees, hold for 3 seconds, and slowly lower the leg.  Perform this exercise against resistance later as your leg gets stronger.  Leg Slides: Lying on your back, slowly slide your foot toward your buttocks, bending your knee up off the floor (only go as far as is comfortable). Then slowly slide your foot back down until your leg is flat on the floor again.  Angel Wings: Lying on your back spread your legs to the side as far apart as you can without causing discomfort.  Hamstring Strength:  Lying on your back, push your heel against the floor with your leg straight by tightening up the muscles of your buttocks.  Repeat, but this time bend your knee to a comfortable angle, and push your heel against the floor.  You may put a pillow under the heel to make it more comfortable if necessary.   A  rehabilitation program following joint replacement surgery can speed recovery and prevent re-injury in the future due to weakened muscles. Contact your doctor or a physical therapist for more information on knee rehabilitation.    CONSTIPATION  Constipation is defined medically as fewer than three stools per week and severe constipation as less than one stool per week.  Even if you have a regular bowel pattern at home, your normal regimen is likely to be disrupted due to multiple reasons following surgery.  Combination of anesthesia, postoperative narcotics, change in appetite and fluid intake all can affect your bowels.   YOU MUST use at least one of the following options; they are listed in order of increasing strength to get the job done.  They are all available over the counter, and you may need to use some, POSSIBLY even all of these options:    Drink plenty of fluids (prune juice may be helpful) and high fiber foods Colace 100 mg by mouth twice a day  Senokot for constipation as directed and as needed Dulcolax (bisacodyl), take with full glass of water  Miralax (polyethylene glycol) once or twice a day as needed.  If you have tried all these things and are unable to have a bowel movement in the first 3-4 days after surgery call either your surgeon or your primary doctor.    If you experience loose stools or diarrhea, hold the medications until you stool forms back up.  If your symptoms do not get better within 1 week or if they get worse, check with your doctor.  If you experience "the worst abdominal pain ever" or develop nausea or vomiting, please contact the office immediately for further recommendations for treatment.   ITCHING:  If you experience itching with your medications, try taking only a single pain pill, or even half a pain pill at a time.  You can also use Benadryl over the counter for itching or also to help with sleep.   TED HOSE STOCKINGS:  Use stockings on both legs until  for at least 2 weeks or as directed by physician office. They may be removed at night for sleeping.  MEDICATIONS:  See your medication summary on the "After  Visit Summary" that nursing will review with you.  You may have some home medications which will be placed on hold until you complete the course of blood thinner medication.  It is important for you to complete the blood thinner medication as prescribed.  Take medicines as prescribed.   You have several different medicines that work in different ways. - Tylenol is for mild to moderate pain. Try to take this medicine before turning to your narcotic medicines.  - Meloxicam is to reduce pain / inflammation - Robaxin is for muscle spasms. This medicine can make you drowsy. - Oxycodone is a narcotic pain medicine.  Take this for severe pain. This medicine can be dehydrating / constipating. - Zofran is for nausea and vomiting. - Aspirin is to prevent blood clots after surgery. YOU MUST TAKE THIS MEDICINE!!  PRECAUTIONS:  If you experience chest pain or shortness of breath - call 911 immediately for transfer to the hospital emergency department.   If you develop a fever greater that 101 F, purulent drainage from wound, increased redness or drainage from wound, foul odor from the wound/dressing, or calf pain - CONTACT YOUR SURGEON.                                                   FOLLOW-UP APPOINTMENTS:  If you do not already have a post-op appointment, please call the office [phone] for an appointment to be seen by Dr. Eulah Pont in 2 weeks.   OTHER INSTRUCTIONS:   MAKE SURE YOU:  Understand these instructions.  Get help right away if you are not doing well or get worse.    Thank you for letting us be a part of your medical care team.  It is a privilege we respect greatly.  We hope these instructions will help you stay on track for a fast and full recovery!   Do not put a pillow under the knee. Place it under the heel.   Complete by: As  directed    Driving restrictions   Complete by: As directed    No driving for 2-4 weeks   Post-operative opioid taper instructions:   Complete by: As directed    POST-OPERATIVE OPIOID TAPER INSTRUCTIONS: It is important to wean off of your opioid medication as soon as possible. If you do not need pain medication after your surgery it is ok to stop day one. Opioids include: Codeine, Hydrocodone(Norco, Vicodin), Oxycodone(Percocet, oxycontin) and hydromorphone amongst others.  Long term and even short term use of opiods can cause: Increased pain response Dependence Constipation Depression Respiratory depression And more.  Withdrawal symptoms can include Flu like symptoms Nausea, vomiting And more Techniques to manage these symptoms Hydrate well Eat regular healthy meals Stay active Use relaxation techniques(deep breathing, meditating, yoga) Do Not substitute Alcohol to help with tapering If you have been on opioids for less than two weeks and do not have pain than it is ok to stop all together.  Plan to wean off of opioids This plan should start within one week post op of your joint replacement. Maintain the same interval or time between taking each dose and first decrease the dose.  Cut the total daily intake of opioids by one tablet each day Next start to increase the time between doses. The last dose that should be eliminated is the evening dose.  Post-operative opioid taper instructions:   Complete by: As directed    POST-OPERATIVE OPIOID TAPER INSTRUCTIONS: It is important to wean off of your opioid medication as soon as possible. If you do not need pain medication after your surgery it is ok to stop day one. Opioids include: Codeine, Hydrocodone(Norco, Vicodin), Oxycodone(Percocet, oxycontin) and hydromorphone amongst others.  Long term and even short term use of opiods can cause: Increased pain response Dependence Constipation Depression Respiratory  depression And more.  Withdrawal symptoms can include Flu like symptoms Nausea, vomiting And more Techniques to manage these symptoms Hydrate well Eat regular healthy meals Stay active Use relaxation techniques(deep breathing, meditating, yoga) Do Not substitute Alcohol to help with tapering If you have been on opioids for less than two weeks and do not have pain than it is ok to stop all together.  Plan to wean off of opioids This plan should start within one week post op of your joint replacement. Maintain the same interval or time between taking each dose and first decrease the dose.  Cut the total daily intake of opioids by one tablet each day Next start to increase the time between doses. The last dose that should be eliminated is the evening dose.      TED hose   Complete by: As directed    Use stockings (TED hose) for 2 weeks on left leg(s).  You may remove them at night for sleeping.   Weight bearing as tolerated   Complete by: As directed       Allergies as of 11/28/2023       Reactions   Metronidazole Hives, Itching   Ibandronic Acid Nausea And Vomiting   Gastritis   Poison Oak Extract Rash   Venlafaxine Swelling, Other (See Comments)   Mouth ulcers   Sulfonamide Derivatives Nausea And Vomiting        Medication List     TAKE these medications    acetaminophen 500 MG tablet Commonly known as: TYLENOL Take 1,000 mg by mouth every 6 (six) hours as needed for moderate pain (pain score 4-6).   amphetamine-dextroamphetamine 20 MG tablet Commonly known as: ADDERALL Take 10 mg by mouth daily in the afternoon.   amphetamine-dextroamphetamine 20 MG tablet Commonly known as: Adderall Take 0.5 tablets (10 mg total) by mouth daily.   amphetamine-dextroamphetamine 20 MG 24 hr capsule Commonly known as: Adderall XR Take 1 capsule (20 mg total) by mouth in the morning.   aspirin EC 81 MG tablet Take 1 tablet (81 mg total) by mouth 2 (two) times daily. To  prevent blood clots for 30 days after surgery.   escitalopram 20 MG tablet Commonly known as: LEXAPRO Take 20 mg by mouth daily.   lansoprazole 15 MG capsule Commonly known as: PREVACID Take 30 mg by mouth at bedtime.   meloxicam 15 MG tablet Commonly known as: MOBIC Take 15 mg by mouth daily.   METAMUCIL FIBER PO Take 1 Scoop by mouth daily.   methocarbamol 750 MG tablet Commonly known as: Robaxin-750 Take 1 tablet (750 mg total) by mouth every 8 (eight) hours as needed for muscle spasms.   ondansetron 4 MG disintegrating tablet Commonly known as: ZOFRAN-ODT Take 1 tablet (4 mg total) by mouth every 8 (eight) hours as needed for nausea or vomiting.   oxyCODONE 5 MG immediate release tablet Commonly known as: Roxicodone Take 1 tablet (5 mg total) by mouth every 4 (four) hours as needed for severe pain (pain score 7-10).  Discharge Care Instructions  (From admission, onward)           Start     Ordered   11/27/23 0000  Weight bearing as tolerated        11/27/23 1352            Follow-up Information     Sheral Apley, MD. Go on 12/12/2023.   Specialty: Orthopedic Surgery Why: Your appointment is at [facility] information: [address] [place] [address] [phone]                 Signed: Marzetta Board 11/28/2023, 4:50 PM
